# Patient Record
Sex: Female | Born: 1975
Health system: Southern US, Community
[De-identification: ages and names within clinical notes are randomized; demographics above are authoritative.]

## PROBLEM LIST (undated history)

## (undated) DIAGNOSIS — G4730 Sleep apnea, unspecified: Secondary | ICD-10-CM

## (undated) DIAGNOSIS — E669 Obesity, unspecified: Secondary | ICD-10-CM

## (undated) DIAGNOSIS — F17200 Nicotine dependence, unspecified, uncomplicated: Secondary | ICD-10-CM

## (undated) DIAGNOSIS — M1990 Unspecified osteoarthritis, unspecified site: Secondary | ICD-10-CM

## (undated) DIAGNOSIS — N809 Endometriosis, unspecified: Secondary | ICD-10-CM

## (undated) DIAGNOSIS — F329 Major depressive disorder, single episode, unspecified: Secondary | ICD-10-CM

## (undated) DIAGNOSIS — G894 Chronic pain syndrome: Secondary | ICD-10-CM

## (undated) DIAGNOSIS — E039 Hypothyroidism, unspecified: Secondary | ICD-10-CM

## (undated) DIAGNOSIS — F419 Anxiety disorder, unspecified: Secondary | ICD-10-CM

## (undated) DIAGNOSIS — G4700 Insomnia, unspecified: Secondary | ICD-10-CM

## (undated) DIAGNOSIS — F32A Depression, unspecified: Secondary | ICD-10-CM

## (undated) HISTORY — DX: Obesity, unspecified: E66.9

## (undated) HISTORY — DX: Insomnia, unspecified: G47.00

## (undated) HISTORY — PX: VAGINAL HYSTERECTOMY: SUR661

## (undated) HISTORY — DX: Major depressive disorder, single episode, unspecified: F32.9

## (undated) HISTORY — DX: Endometriosis, unspecified: N80.9

## (undated) HISTORY — DX: Chronic pain syndrome: G89.4

## (undated) HISTORY — DX: Nicotine dependence, unspecified, uncomplicated: F17.200

## (undated) HISTORY — PX: ANTERIOR CRUCIATE LIGAMENT REPAIR: SHX115

## (undated) HISTORY — DX: Depression, unspecified: F32.A

---

## 1998-12-21 ENCOUNTER — Other Ambulatory Visit: Admission: RE | Admit: 1998-12-21 | Discharge: 1998-12-21 | Payer: Self-pay | Admitting: Obstetrics and Gynecology

## 1999-08-12 ENCOUNTER — Ambulatory Visit (HOSPITAL_BASED_OUTPATIENT_CLINIC_OR_DEPARTMENT_OTHER): Admission: RE | Admit: 1999-08-12 | Discharge: 1999-08-13 | Payer: Self-pay | Admitting: Specialist

## 1999-12-25 ENCOUNTER — Other Ambulatory Visit: Admission: RE | Admit: 1999-12-25 | Discharge: 1999-12-25 | Payer: Self-pay | Admitting: Obstetrics and Gynecology

## 2000-03-01 ENCOUNTER — Encounter: Payer: Self-pay | Admitting: Emergency Medicine

## 2000-03-01 ENCOUNTER — Emergency Department (HOSPITAL_COMMUNITY): Admission: EM | Admit: 2000-03-01 | Discharge: 2000-03-01 | Payer: Self-pay | Admitting: Emergency Medicine

## 2001-11-23 ENCOUNTER — Encounter: Payer: Self-pay | Admitting: Emergency Medicine

## 2001-11-23 ENCOUNTER — Emergency Department (HOSPITAL_COMMUNITY): Admission: EM | Admit: 2001-11-23 | Discharge: 2001-11-23 | Payer: Self-pay | Admitting: Emergency Medicine

## 2002-04-26 ENCOUNTER — Inpatient Hospital Stay (HOSPITAL_COMMUNITY): Admission: AD | Admit: 2002-04-26 | Discharge: 2002-04-26 | Payer: Self-pay | Admitting: *Deleted

## 2002-04-26 ENCOUNTER — Encounter: Payer: Self-pay | Admitting: *Deleted

## 2002-06-16 ENCOUNTER — Encounter: Payer: Self-pay | Admitting: Obstetrics & Gynecology

## 2002-06-16 ENCOUNTER — Ambulatory Visit (HOSPITAL_COMMUNITY): Admission: RE | Admit: 2002-06-16 | Discharge: 2002-06-16 | Payer: Self-pay | Admitting: Obstetrics & Gynecology

## 2002-07-02 ENCOUNTER — Inpatient Hospital Stay (HOSPITAL_COMMUNITY): Admission: AD | Admit: 2002-07-02 | Discharge: 2002-07-02 | Payer: Self-pay | Admitting: Obstetrics

## 2002-07-07 ENCOUNTER — Ambulatory Visit (HOSPITAL_COMMUNITY): Admission: RE | Admit: 2002-07-07 | Discharge: 2002-07-07 | Payer: Self-pay | Admitting: Obstetrics & Gynecology

## 2002-07-07 ENCOUNTER — Encounter: Payer: Self-pay | Admitting: Obstetrics & Gynecology

## 2002-08-23 ENCOUNTER — Encounter: Payer: Self-pay | Admitting: Obstetrics & Gynecology

## 2002-08-23 ENCOUNTER — Ambulatory Visit (HOSPITAL_COMMUNITY): Admission: RE | Admit: 2002-08-23 | Discharge: 2002-08-23 | Payer: Self-pay | Admitting: Obstetrics & Gynecology

## 2002-11-23 ENCOUNTER — Inpatient Hospital Stay (HOSPITAL_COMMUNITY): Admission: AD | Admit: 2002-11-23 | Discharge: 2002-11-25 | Payer: Self-pay | Admitting: Obstetrics & Gynecology

## 2002-11-23 ENCOUNTER — Ambulatory Visit (HOSPITAL_COMMUNITY): Admission: RE | Admit: 2002-11-23 | Discharge: 2002-11-23 | Payer: Self-pay | Admitting: Obstetrics & Gynecology

## 2002-11-23 ENCOUNTER — Encounter: Payer: Self-pay | Admitting: Obstetrics & Gynecology

## 2002-11-23 ENCOUNTER — Encounter (INDEPENDENT_AMBULATORY_CARE_PROVIDER_SITE_OTHER): Payer: Self-pay

## 2002-11-24 ENCOUNTER — Encounter (INDEPENDENT_AMBULATORY_CARE_PROVIDER_SITE_OTHER): Payer: Self-pay

## 2002-12-02 ENCOUNTER — Encounter: Admission: RE | Admit: 2002-12-02 | Discharge: 2003-01-01 | Payer: Self-pay | Admitting: Obstetrics and Gynecology

## 2004-02-18 HISTORY — PX: PLACEMENT OF BREAST IMPLANTS: SHX6334

## 2009-11-03 ENCOUNTER — Emergency Department (HOSPITAL_BASED_OUTPATIENT_CLINIC_OR_DEPARTMENT_OTHER): Admission: EM | Admit: 2009-11-03 | Discharge: 2009-11-03 | Payer: Self-pay | Admitting: Emergency Medicine

## 2009-11-03 ENCOUNTER — Ambulatory Visit: Payer: Self-pay | Admitting: Diagnostic Radiology

## 2009-11-27 ENCOUNTER — Encounter
Admission: RE | Admit: 2009-11-27 | Discharge: 2010-01-30 | Payer: Self-pay | Source: Home / Self Care | Attending: Orthopedic Surgery | Admitting: Orthopedic Surgery

## 2010-05-02 LAB — CBC
Hemoglobin: 12.8 g/dL (ref 12.0–15.0)
MCV: 87.3 fL (ref 78.0–100.0)
Platelets: 243 10*3/uL (ref 150–400)
RBC: 4.12 MIL/uL (ref 3.87–5.11)
WBC: 9 10*3/uL (ref 4.0–10.5)

## 2010-05-02 LAB — BASIC METABOLIC PANEL
Calcium: 8.3 mg/dL — ABNORMAL LOW (ref 8.4–10.5)
Chloride: 108 mEq/L (ref 96–112)
Creatinine, Ser: 0.9 mg/dL (ref 0.4–1.2)
GFR calc Af Amer: >60 mL/min (ref >60)
Sodium: 142 mEq/L (ref 135–145)

## 2010-05-02 LAB — DIFFERENTIAL
Lymphocytes Relative: 24 % (ref 12–46)
Lymphs Abs: 2.1 10*3/uL (ref 0.7–4.0)
Neutrophils Relative %: 70 % (ref 43–77)

## 2010-07-05 NOTE — Op Note (Signed)
[place]  Patient:    Alicia Farmer, Alicia Farmer                             MRN: [id number] Proc. Date: 08/12/99 Adm. Date:  [id number] Attending:  Gustavus Messing CC:         Yaakov Guthrie. Shon Hough, M.D.                           Operative Report  PREOPERATIVE DIAGNOSIS:  POSTOPERATIVE DIAGNOSIS:  OPERATION PERFORMED:  Bilateral breast reductions using the inferior pedicle technique, reduction of accessory breast tissue.  SURGEON:  Yaakov Guthrie. Shon Hough, M.D.  ASSISTANT:  Margaretha Sheffield, RN  ANESTHESIA:  INDICATIONS FOR PROCEDURE:  The patient is a 35 year old lady with severe macromastia, back and shoulder pain secondary to large pendulous breasts.  She wears a triple-D bra.  DESCRIPTION OF PROCEDURE:  The patient was set up and drawn for the reduction mammoplasty using the inferior pedicle technique.  She was redrawn 20 cm from the nipple areolar complex.  She then underwent general anesthesia, intubated orally.  Prep was done to the chest and breast areas in a routine fashion using Betadine soap and solution and walled off with sterile towels and drapes so as to make a sterile field.  0.25% Xylocaine with epinephrine was injected locally, 150 cc per side.  The wounds were scored with #15 blade and the skin over the inferior pedicle was de-epithelialized with a #20 blade.  After proper hemostasis, the medial and lateral fatty dermal pedicles were excised down in the line of fascia.  Hemostasis was maintained with the Bovie unit on coagulation.  Out laterally, more tissue was removed to improve symmetry in that the patient had increased ____________ aspect of the breast as well as accessory breast tissue in the axillary and latissimus dorsi regions.  After proper hemostasis ____________ , the new keyhole area was also debulked.  The flaps were then transposed ____________ after proper hemostasis and secured with 3-0 Prolene.  Subcutaneous closure was  done with 3-0 Monocryl x 2 layers and a running subcuticular stitch of 3-0 Monocryl and 5-0 Monocryl throughout the inverted T.  The wounds were drained with #10 Blake drains which were placed in the depths of the wound and brought out through the lateralmost portion of the incision and secured with 3-0 Prolene.  The wounds were cleansed.  Steri-Strips and soft dressings were applied to all the areas including Xeroform, 4 x 4s, ABDs and Hypafix tape.  She withstood the procedures very well and was taken to recovery in excellent condition. DD:  08/12/99 TD:  08/13/99 Job: [id number]

## 2010-07-05 NOTE — Op Note (Signed)
NAME:  Alicia Farmer, Alicia Farmer                         ACCOUNT NO.:  [id number]   MEDICAL RECORD NO.:  [id number]                   PATIENT TYPE:  INP   LOCATION:  9127                                 FACILITY:  [facility]   PHYSICIAN:  Roseanna Rainbow, M.D.         DATE OF BIRTH:  1976-01-04   DATE OF PROCEDURE:  11/24/2002  DATE OF DISCHARGE:                                 OPERATIVE REPORT   PREOPERATIVE DIAGNOSIS:  Multipara, desires permanent sterilization.   POSTOPERATIVE DIAGNOSIS:  Multipara, desires permanent sterilization.   PROCEDURE:  Postpartum tubal ligation, modified Pomeroy method.   SURGEON:  Roseanna Rainbow, M.D.   ANESTHESIA:  Epidural, local.   COMPLICATIONS:  None.   ESTIMATED BLOOD LOSS:  Less than 20 mL.   INDICATIONS:  The patient is a 35 year old para 3, status post NSVD, who  desires permanent sterilization.  The risks and benefits of the procedure  were discussed with the patient including the risks of failure of four to  eight per 1000 with an increased risk of an ectopic pregnancy if pregnancy  occurs.   FINDINGS:  Normal tubes.   DESCRIPTION OF PROCEDURE:  The patient was taken to the operating room,  where her epidural anesthetic was found to be one-sided.  The infraumbilical  area was then infiltrated with 0.25% Marcaine.  A small transverse  infraumbilical skin incision was then made with a scalpel.  The incision was  carried down through the underlying fascia.  The peritoneum was identified  and entered.  The peritoneum was noted to be free of any adhesions.  The  patient's left fallopian tube was then identified, brought to the incision,  and grasped with a Babcock clamp.  The tube was then followed out to the  fimbriae.  The Babcock clamp was then used to grasp the tube approximately 4  cm from the cornual region.  A 2-3 cm segment of tube was then doubly  ligated with free ties of plain gut and excised.  Good hemostasis was noted  and  the tube was returned to the abdomen.  The right fallopian tube was  manipulated in a similar fashion.  The peritoneum and fascia were closed in  a single layer using 0 Vicryl.  The skin was closed in a subcuticular  fashion using 3-0 Vicryl.  The patient tolerated the procedure well.  Sponge, lap, and needle counts were correct x2.  The patient was taken to  the PACU in stable condition.   PATHOLOGY:  Segments of right and left fallopian tubes.                                               Roseanna Rainbow, M.D.    Judee Clara  D:  11/24/2002  T:  11/25/2002  Job:  [id number]

## 2016-02-18 HISTORY — PX: ARTHROPLASTY: SHX135

## 2016-02-26 ENCOUNTER — Other Ambulatory Visit (HOSPITAL_COMMUNITY): Payer: Self-pay | Admitting: General Surgery

## 2016-03-04 ENCOUNTER — Encounter: Payer: BLUE CROSS/BLUE SHIELD | Attending: General Surgery | Admitting: Skilled Nursing Facility1

## 2016-03-04 ENCOUNTER — Encounter: Payer: Self-pay | Admitting: Skilled Nursing Facility1

## 2016-03-04 DIAGNOSIS — Z6841 Body Mass Index (BMI) 40.0 and over, adult: Secondary | ICD-10-CM | POA: Diagnosis not present

## 2016-03-04 DIAGNOSIS — Z713 Dietary counseling and surveillance: Secondary | ICD-10-CM | POA: Insufficient documentation

## 2016-03-04 DIAGNOSIS — E6609 Other obesity due to excess calories: Secondary | ICD-10-CM

## 2016-03-04 NOTE — Progress Notes (Signed)
  Pre-Op Assessment Visit:  Pre-Operative Roux-en-Y Surgery  Medical Nutrition Therapy:  Appt start time: 9:13   End time:  9:50  Patient was seen on 03/04/2016 for Pre-Operative Nutrition Assessment. Assessment and letter of approval faxed to [facility] Surgery Bariatric Surgery Program coordinator on 03/04/2016.   Preferred Learning Style:   No preference indicated   Learning Readiness:   Change in progress  Handouts given during visit include:  Pre-Op Goals Bariatric Surgery Protein Shakes  During the appointment today the following Pre-Op Goals were reviewed with the patient: Maintain or lose weight as instructed by your surgeon Make healthy food choices Begin to limit portion sizes Limited concentrated sugars and fried foods Keep fat/sugar in the single digits per serving on food labels Practice CHEWING your food  (aim for 30 chews per bite or until applesauce consistency) Practice not drinking 15 minutes before, during, and 30 minutes after each meal/snack Avoid all carbonated beverages  Avoid/limit caffeinated beverages  Avoid all sugar-sweetened beverages Consume 3 meals per day; eat every 3-5 hours Make a list of non-food related activities Aim for 64-100 ounces of FLUID daily  Aim for at least 60-80 grams of PROTEIN daily Look for a liquid protein source that contain ?15 g protein and ?5 g carbohydrate  (ex: shakes, drinks, shots)  Patient-Centered Goals: 10/10 specific/non-scale and confidence/importance scale 1-10  Demonstrated degree of understanding via:  Teach Back  Teaching Method Utilized:  Visual Auditory Hands on  Barriers to learning/adherence to lifestyle change: 10/10  Patient to call the [facility] to enroll in Pre-Op and Post-Op Nutrition Education when surgery date is scheduled.

## 2016-03-04 NOTE — Patient Instructions (Signed)
Follow Pre-Op Goals Try Protein Shakes Call [facility] at [phone] when surgery is scheduled to enroll in Pre-Op Class  Things to remember:  Please always be honest with us. We want to support you!  If you have any questions or concerns in between appointments, please call or email Liz, Leslie, or Laurie.  The diet after surgery will be high protein and low in carbohydrate.  Vitamins and calcium need to be taken for the rest of your life.  Feel free to include support people in any classes or appointments.   Supplement recommendations:  Before Surgery   1 Complete Multivitamin with Iron  3000 IU Vitamin D3  After Surgery   2 Chewable Multivitamins  **Best Choice - Bariatric Advantage Advanced Multi EA      3 Chewable Calcium (500 mg each, total 1200-1500 mg per day)  **Best Choice - Celebrate, Bariatric Advantage, or Wellesse  Other Options:    2 Flinstones Complete + up to 100 mg Thiamin + 2000-3000 IU Vitamin D3 + 350-500 mcg Vitamin B12 + 30-45 mg Iron (with history of deficiency)  2 Celebrate MultiComplete with 18 mg Iron (this provides 6000 IU of  Vitamin D3)  4 Celebrate Essential Multi 2 in 1 (has calcium) + 18-60 mg separate  iron  Vitamins and Calcium are available at:   [facility]   [address], [place], [place] [address]   [email or web address]  [email or web address]  [email or web address]   

## 2016-03-12 ENCOUNTER — Ambulatory Visit (HOSPITAL_COMMUNITY)
Admission: RE | Admit: 2016-03-12 | Discharge: 2016-03-12 | Disposition: A | Discharge status: Home / Self Care | Payer: BLUE CROSS/BLUE SHIELD | Source: Ambulatory Visit | Attending: General Surgery | Admitting: General Surgery

## 2016-03-12 DIAGNOSIS — Z01818 Encounter for other preprocedural examination: Secondary | ICD-10-CM | POA: Insufficient documentation

## 2016-03-12 DIAGNOSIS — Z01810 Encounter for preprocedural cardiovascular examination: Secondary | ICD-10-CM | POA: Diagnosis present

## 2016-04-03 ENCOUNTER — Ambulatory Visit (INDEPENDENT_AMBULATORY_CARE_PROVIDER_SITE_OTHER): Payer: BLUE CROSS/BLUE SHIELD | Admitting: Psychiatry

## 2016-04-03 DIAGNOSIS — F509 Eating disorder, unspecified: Secondary | ICD-10-CM

## 2016-04-10 ENCOUNTER — Ambulatory Visit (INDEPENDENT_AMBULATORY_CARE_PROVIDER_SITE_OTHER): Payer: BLUE CROSS/BLUE SHIELD | Admitting: Pulmonary Disease

## 2016-04-10 ENCOUNTER — Encounter: Payer: Self-pay | Admitting: Pulmonary Disease

## 2016-04-10 VITALS — BP 126/82 | HR 102 | Ht 69.0 in | Wt 273.6 lb

## 2016-04-10 DIAGNOSIS — G4733 Obstructive sleep apnea (adult) (pediatric): Secondary | ICD-10-CM | POA: Diagnosis not present

## 2016-04-10 NOTE — Assessment & Plan Note (Signed)
Given excessive daytime somnolence, narrow pharyngeal exam, witnessed apneas & loud snoring, obstructive sleep apnea is very likely & an overnight polysomnogram will be scheduled as a split study. The pathophysiology of obstructive sleep apnea , it's cardiovascular consequences & modes of treatment including CPAP were discused with the patient in detail & they evidenced understanding.  Pretest probability is high 

## 2016-04-10 NOTE — Patient Instructions (Signed)
Schedule sleep study. We discussed treatment options

## 2016-04-10 NOTE — Progress Notes (Signed)
Subjective:    Patient ID: Alicia Farmer, female    DOB: Jan 01, 1976, 41 y.o.   MRN: [id number]  HPI  41 year old smoker presents for evaluation of sleep-disordered breathing. She has gained 100 pounds over the last few years to her current weight of 273 pounds. She is undergoing bariatric evaluation and is considering gastric bypass surgery after failing diet and exercise, hence she is referred  Her husband Alicia Farmer who accompanies, reports witnessed apneas. She reports gasping episodes that wake her up from sleep on occasion and loud snoring. She actually dreams about choking in her sleep and occasional episodes of biting her tongue and her sleep and limb jerks. She reports non-refreshing sleep in spite of 10-11 hours of sleep every night. She has some degree of sleep onset insomnia and was placed on Xanax 0.5 mg at bedtime. Bedtime is around 11 PM, sleep latency varies from 30 minutes to about an hour, she sleeps on her side with 2 pillows, reports frequent nocturnal awakenings due to gasping episodes and dreams and is out of bed by 7 AM to get her son to school on a weekday. 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  Past Surgical History:  Procedure Laterality Date  . ABDOMINAL HYSTERECTOMY       Allergies  Allergen Reactions  . Meloxicam Nausea And Vomiting     Social History   Social History  . Marital status: Married    Spouse name: N/A  . Number of children: N/A  . Years of education: N/A   Occupational History  . Not on file.   Social  History Main Topics  . Smoking status: Former Smoker    Types: Cigarettes    Start date: 04/10/1996    Quit date: 02/18/2016  . Smokeless tobacco: Never Used  . Alcohol use No  . Drug use: No  . Sexual activity: Not on file   Other Topics Concern  . Not on file   Social History Narrative  . No narrative on file     Family History  Problem Relation Age of Onset  . Hypertension Other   . Hyperlipidemia Other     Review of Systems Constitutional: negative for anorexia, fevers and sweats  Eyes: negative for irritation, redness and visual disturbance  Ears, nose, mouth, throat, and face: negative for earaches, epistaxis, nasal congestion and sore throat  Respiratory: negative for cough, dyspnea on exertion, sputum and wheezing  Cardiovascular: negative for chest pain, dyspnea, lower extremity edema, orthopnea, palpitations and syncope  Gastrointestinal: negative for abdominal pain, constipation, diarrhea, melena, nausea and vomiting  Genitourinary:negative for dysuria, frequency and hematuria  Hematologic/lymphatic: negative for bleeding, easy bruising and lymphadenopathy  Musculoskeletal:negative for arthralgias, muscle weakness and stiff joints  Neurological: negative for coordination problems, gait problems, headaches and weakness  Endocrine: negative for diabetic symptoms including polydipsia, polyuria and weight loss     Objective:   Physical Exam  Gen. Pleasant, obese, in no distress, normal affect ENT - no lesions, no post nasal drip, class 2-3 airway  Neck: No JVD, no thyromegaly, no carotid bruits Lungs: no use of accessory muscles, no dullness to percussion, decreased without rales or rhonchi  Cardiovascular: Rhythm regular, heart sounds  normal, no murmurs or gallops, no peripheral edema Abdomen: soft and non-tender, no hepatosplenomegaly, BS normal. Musculoskeletal: No deformities, no cyanosis or clubbing Neuro:  alert, non focal, no tremors       Assessment &  Plan:

## 2016-04-11 ENCOUNTER — Other Ambulatory Visit: Payer: Self-pay

## 2016-04-11 DIAGNOSIS — G4733 Obstructive sleep apnea (adult) (pediatric): Secondary | ICD-10-CM

## 2016-04-15 ENCOUNTER — Telehealth: Payer: Self-pay | Admitting: Pulmonary Disease

## 2016-04-15 NOTE — Telephone Encounter (Signed)
Patient is scheduled to come in 2/28 to pick up the Home Sleep Machine. Husband is aware of this pick up time

## 2016-04-16 ENCOUNTER — Ambulatory Visit: Payer: Self-pay | Admitting: Psychiatry

## 2016-04-27 DIAGNOSIS — G4733 Obstructive sleep apnea (adult) (pediatric): Secondary | ICD-10-CM | POA: Diagnosis not present

## 2016-05-05 ENCOUNTER — Telehealth: Payer: Self-pay | Admitting: Pulmonary Disease

## 2016-05-05 DIAGNOSIS — G4733 Obstructive sleep apnea (adult) (pediatric): Secondary | ICD-10-CM | POA: Diagnosis not present

## 2016-05-05 NOTE — Telephone Encounter (Signed)
Per Dr. Elsworth Soho, HST showed mild OSA with 12/h in lateral position and 29/h in supine.   He recommends a CPAP titration if willing. If not, focus on weight loss to help.

## 2016-05-06 NOTE — Telephone Encounter (Signed)
Spoke with patient regarding results. Patient verbalized understanding. Stated she wants to try CPAP. Will send in order for a CPAP titration. Nothing else was needed at time of call.

## 2016-05-08 ENCOUNTER — Other Ambulatory Visit: Payer: Self-pay

## 2016-05-08 ENCOUNTER — Other Ambulatory Visit: Payer: Self-pay | Admitting: *Deleted

## 2016-05-08 DIAGNOSIS — G4733 Obstructive sleep apnea (adult) (pediatric): Secondary | ICD-10-CM

## 2016-05-09 ENCOUNTER — Encounter (HOSPITAL_BASED_OUTPATIENT_CLINIC_OR_DEPARTMENT_OTHER): Payer: Self-pay

## 2016-05-15 NOTE — Progress Notes (Signed)
Please place orders in EPIC as patient is being scheduled for a Pre-op appointment! Thank you! 

## 2016-05-20 NOTE — Progress Notes (Signed)
Please place orders in EPIC as patient is being scheduled for a Pre-op appointment! Thank you! 

## 2016-05-22 ENCOUNTER — Telehealth: Payer: Self-pay | Admitting: Pulmonary Disease

## 2016-05-22 NOTE — Telephone Encounter (Signed)
Spoke with the pt and notified of recs per RA  She verbalized understanding and will call for appt then  Nothing further needed

## 2016-05-22 NOTE — Telephone Encounter (Signed)
RA  Alicia Farmer from [facility] called the pt to set up her cpap and the pt refused at this time.  She stated that she will be having the gastric bypass surgery the end of the month and is hoping that she will not need to be set up with cpap.  FYI for you.  Anything further?  thanks

## 2016-05-22 NOTE — Telephone Encounter (Signed)
Reassess after weight loss -6-9 months

## 2016-05-26 ENCOUNTER — Encounter (HOSPITAL_BASED_OUTPATIENT_CLINIC_OR_DEPARTMENT_OTHER): Payer: Self-pay

## 2016-05-26 ENCOUNTER — Encounter: Payer: BLUE CROSS/BLUE SHIELD | Attending: General Surgery | Admitting: Skilled Nursing Facility1

## 2016-05-26 ENCOUNTER — Ambulatory Visit: Payer: Self-pay | Admitting: Registered"

## 2016-05-26 DIAGNOSIS — Z713 Dietary counseling and surveillance: Secondary | ICD-10-CM | POA: Diagnosis present

## 2016-05-26 DIAGNOSIS — Z6841 Body Mass Index (BMI) 40.0 and over, adult: Secondary | ICD-10-CM | POA: Insufficient documentation

## 2016-05-26 DIAGNOSIS — E669 Obesity, unspecified: Secondary | ICD-10-CM

## 2016-05-28 ENCOUNTER — Encounter: Payer: Self-pay | Admitting: Skilled Nursing Facility1

## 2016-05-28 NOTE — Progress Notes (Signed)
  Pre-Operative Nutrition Class:  Appt start time: 8177   End time:  1830.  Patient was seen on 05/26/2016 for Pre-Operative Bariatric Surgery Education at [facility].   Surgery date: 06/09/2016 Surgery type: RYGB Start weight at [facility]: 285.3 lbs Weight today: 269.3 lbs  TANITA  BODY COMP RESULTS     BMI (kg/m^2)    Fat Mass (lbs)    Fat Free Mass (lbs)    Total Body Water (lbs)    Samples given per MNT protocol. Patient educated on appropriate usage: Bariatric Advantage Multivitamin Lot # [id number] Exp: 6/19  Celebrate Vitamins Calcium Citrate Lot # [id number] Exp:10/19  Unjury Protein  Lot # [id number] Exp: sep-16-18  The following the learning objectives were met by the patient during this course:  Identify Pre-Op Dietary Goals and will begin 2 weeks pre-operatively  Identify appropriate sources of fluids and proteins   State protein recommendations and appropriate sources pre and post-operatively  Identify Post-Operative Dietary Goals and will follow for 2 weeks post-operatively  Identify appropriate multivitamin and calcium sources  Describe the need for physical activity post-operatively and will follow MD recommendations  State when to call healthcare provider regarding medication questions or post-operative complications  Handouts given during class include:  Pre-Op Bariatric Surgery Diet Handout  Protein Shake Handout  Post-Op Bariatric Surgery Nutrition Handout  [facility] Program Information Flyer  Support Group Information Flyer  [facility] Bariatric Supplements Price List  Follow-Up Plan: [facility] for diet advancement per MD.

## 2016-05-29 ENCOUNTER — Ambulatory Visit: Payer: Self-pay | Admitting: General Surgery

## 2016-05-29 NOTE — H&P (Signed)
History of Present Illness Geoffery Spruce MD; 05/29/2016 4:38 PM) The patient is a 41 year old female who presents for a bariatric surgery evaluation. Patient has completed all necessary workup in the preoperative phase for bariatric surgery. The patient is now ready to proceed with surgery. The patient has made moderate improvements in her diet and is exercising well and has no new medical issues or new medications.    Allergies Malachy Moan, RMA; 05/29/2016 4:16 PM) Meloxicam *ANALGESICS - ANTI-INFLAMMATORY*  Nausea, Vomiting.  Medication History Malachy Moan, [place]; 05/29/2016 4:17 PM) ALPRAZolam (0.5MG  Tablet, Oral) Active. Zolpidem Tartrate (10MG  Tablet, Oral) Active. Vitamin D (Ergocalciferol) (50000UNIT Capsule, Oral) Active. Citalopram Hydrobromide (40MG  Tablet, Oral) Active. Medications Reconciled    Review of Systems Geoffery Spruce MD; 05/29/2016 4:38 PM) General Present- Fatigue, Night Sweats and Weight Gain. Not Present- Appetite Loss, Chills, Fever and Weight Loss. HEENT Present- Seasonal Allergies. Not Present- Earache, Hearing Loss, Hoarseness, Nose Bleed, Oral Ulcers, Ringing in the Ears, Sinus Pain, Sore Throat, Visual Disturbances, Wears glasses/contact lenses and Yellow Eyes. Respiratory Present- Snoring. Not Present- Bloody sputum, Chronic Cough, Difficulty Breathing and Wheezing. Breast Not Present- Breast Mass, Breast Pain, Nipple Discharge and Skin Changes. Cardiovascular Present- Difficulty Breathing Lying Down, Shortness of Breath and Swelling of Extremities. Not Present- Chest Pain, Leg Cramps, Palpitations and Rapid Heart Rate. Gastrointestinal Present- Indigestion. Not Present- Abdominal Pain, Bloating, Bloody Stool, Change in Bowel Habits, Chronic diarrhea, Constipation, Difficulty Swallowing, Excessive gas, Gets full quickly at meals, Hemorrhoids, Nausea, Rectal Pain and Vomiting. Female Genitourinary Not Present- Frequency, Nocturia,  Painful Urination, Pelvic Pain and Urgency. Musculoskeletal Present- Back Pain, Joint Pain, Joint Stiffness, Muscle Pain, Muscle Weakness and Swelling of Extremities. Neurological Not Present- Decreased Memory, Fainting, Headaches, Numbness, Seizures, Tingling, Tremor, Trouble walking and Weakness. Psychiatric Present- Depression. Not Present- Anxiety, Bipolar, Change in Sleep Pattern, Fearful and Frequent crying. Endocrine Present- Hot flashes. Not Present- Cold Intolerance, Excessive Hunger, Hair Changes, Heat Intolerance and New Diabetes. Hematology Not Present- Blood Thinners, Easy Bruising, Excessive bleeding, Gland problems, HIV and Persistent Infections.  Vitals Malachy Moan RMA; 05/29/2016 4:18 PM) 05/29/2016 4:17 PM Weight: 266.2 lb Height: 69in Body Surface Area: 2.33 m Body Mass Index: 39.31 kg/m  Temp.: 98.4F  Pulse: 135 (Regular)  BP: 122/80 (Sitting, Left Arm, Standard)       Physical Exam  Addendum Note(Luke Loni Muse Kinsinger MD; 05/29/2016 5:02 PM) Physical Exam Geoffery Spruce, MD; 02/21/2016 4:30 PM) General Mental Status - Alert. General Appearance - Cooperative. Orientation - Oriented X4. Build & Nutrition - Obese. Posture - Normal posture.  Integumentary Global Assessment Normal Exam - Head/Face: no rashes, ulcers, lesions or evidence of photo damage. No palpable nodules or masses and Neck: no visible lesions or palpable masses.  Head and Neck Head - normocephalic, atraumatic with no lesions or palpable masses. Face Global Assessment - atraumatic. Thyroid Gland Characteristics - normal size and consistency.  Eye Eyeball - Bilateral - Extraocular movements intact. Sclera/Conjunctiva - Bilateral - No scleral icterus, No Discharge.  ENMT Nose and Sinuses Nose - no deformities observed, no swelling present.  Chest and Lung Exam Palpation Normal exam - Non-tender. Auscultation Breath sounds -  Normal.  Cardiovascular Auscultation Rhythm - Regular. Heart Sounds - S1 WNL and S2 WNL. Carotid arteries - No Carotid bruit.  Abdomen Inspection Normal Exam - No Visible peristalsis, No Abnormal pulsations and No Paradoxical movements. Palpation/Percussion Normal exam - Soft, Non Tender, No Rebound tenderness, No Rigidity (guarding), No hepatosplenomegaly and No Palpable  abdominal masses.  Peripheral Vascular Upper Extremity Palpation - Pulses bilaterally normal. Lower Extremity Palpation - Edema - Bilateral - No edema.  Neurologic Neurologic evaluation reveals - normal sensation and normal coordination.  Neuropsychiatric Mental status exam performed with findings of - able to articulate well with normal speech/language, rate, volume and coherence and thought content normal with ability to perform basic computations and apply abstract reasoning.  Musculoskeletal Normal Exam - Bilateral - Upper Extremity Strength Normal and Lower Extremity Strength Normal.    Assessment & Plan Geoffery Spruce MD; 05/29/2016 4:37 PM) MORBID OBESITY (E66.01) Story: As completed all requirements is ready to proceed with Roux-en-Y gastric bypass. We'll test for nicotine due to her quitting on March 06, 2016 Impression: We discussed laparoscopic Roux-en-Y gastric bypass. We discussed the preoperative, operative and postoperative process. Using diagrams, I explained the surgery in detail including the performance of an EGD near the end of the surgery to check for leak. We discussed the typical hospital course including a 2-3 day stay baring any complications. The patient was given educational material. I quoted the patient that they can expect to lose 50-70% of their excess weight with the gastric bypass. We did discuss the possibility of weight regain several years after the procedure.  We discussed the risk and benefits of surgery including but not limited to anesthesia risk, bleeding, infection,  anastomotic edema requiring a few additional days in the hospital, postop nausea, possible conversion to open procedure, incisional hernia, injury to surrounding structures, injury to surrounding structures, blood clot formation, anastomotic leak, anastomotic stricture, ulcer formation, death, respiratory complications, intestinal blockage, internal hernia, gallstone formation, vitamin and nutritional deficiencies, hair loss, weight regain, failure to lose weight and mood changes.  We discussed that before and after surgery that there would be an alteration in their diet. I explained that we have put them on a diet 2 weeks before surgery. I also explained that they would be on a liquid diet for 2 weeks after surgery. We discussed that they would have to avoid certain foods such as sugar after surgery. We discussed the importance of physical activity as well as compliance with our dietary and supplement recommendations and routine follow-up. Current Plans NICOTINE (27035)

## 2016-06-03 ENCOUNTER — Encounter (HOSPITAL_COMMUNITY): Payer: Self-pay | Admitting: Emergency Medicine

## 2016-06-03 NOTE — Patient Instructions (Addendum)
JACKLINE CASTILLA  06/03/2016   Your procedure is scheduled on: 06-09-16  Report to [facility] Main  Entrance Take [place]  elevators to 3rd floor to  [place] at [phone].   Call this number if you have problems the morning of surgery [phone]    Remember: ONLY 1 PERSON MAY GO WITH YOU TO SHORT STAY TO GET  READY MORNING OF [place].  Do not eat food or drink liquids :After Midnight.     Take these medicines the morning of surgery with A SIP OF WATER: none                                You may not have any metal on your body including hair pins and              piercings  Do not wear jewelry, make-up, lotions, powders or perfumes, deodorant             Do not wear nail polish.  Do not shave  48 hours prior to surgery.     Do not bring valuables to the hospital. [place].  Contacts, dentures or bridgework may not be worn into surgery.  Leave suitcase in the car. After surgery it may be brought to your room.                Please read over the following fact sheets you were given: _____________________________________________________________________             [facility] - Preparing for Surgery Before surgery, you can play an important role.  Because skin is not sterile, your skin needs to be as free of germs as possible.  You can reduce the number of germs on your skin by washing with CHG (chlorahexidine gluconate) soap before surgery.  CHG is an antiseptic cleaner which kills germs and bonds with the skin to continue killing germs even after washing. Please DO NOT use if you have an allergy to CHG or antibacterial soaps.  If your skin becomes reddened/irritated stop using the CHG and inform your nurse when you arrive at Short Stay. Do not shave (including legs and underarms) for at least 48 hours prior to the first CHG shower.  You may shave your face/neck. Please follow these instructions  carefully:  1.  Shower with CHG Soap the night before surgery and the  morning of Surgery.  2.  If you choose to wash your hair, wash your hair first as usual with your  normal  shampoo.  3.  After you shampoo, rinse your hair and body thoroughly to remove the  shampoo.                           4.  Use CHG as you would any other liquid soap.  You can apply chg directly  to the skin and wash                       Gently with a scrungie or clean washcloth.  5.  Apply the CHG Soap to your body ONLY FROM THE NECK DOWN.   Do not use on face/ open  Wound or open sores. Avoid contact with eyes, ears mouth and genitals (private parts).                       Wash face,  Genitals (private parts) with your normal soap.             6.  Wash thoroughly, paying special attention to the area where your surgery  will be performed.  7.  Thoroughly rinse your body with warm water from the neck down.  8.  DO NOT shower/wash with your normal soap after using and rinsing off  the CHG Soap.                9.  Pat yourself dry with a clean towel.            10.  Wear clean pajamas.            11.  Place clean sheets on your bed the night of your first shower and do not  sleep with pets. Day of Surgery : Do not apply any lotions/deodorants the morning of surgery.  Please wear clean clothes to the hospital/surgery center.  FAILURE TO FOLLOW THESE INSTRUCTIONS MAY RESULT IN THE CANCELLATION OF YOUR SURGERY PATIENT SIGNATURE_________________________________  NURSE SIGNATURE__________________________________  ________________________________________________________________________   Adam Phenix  An incentive spirometer is a tool that can help keep your lungs clear and active. This tool measures how well you are filling your lungs with each breath. Taking long deep breaths may help reverse or decrease the chance of developing breathing (pulmonary) problems (especially infection)  following:  A long period of time when you are unable to move or be active. BEFORE THE PROCEDURE   If the spirometer includes an indicator to show your best effort, your nurse or respiratory therapist will set it to a desired goal.  If possible, sit up straight or lean slightly forward. Try not to slouch.  Hold the incentive spirometer in an upright position. INSTRUCTIONS FOR USE  1. Sit on the edge of your bed if possible, or sit up as far as you can in bed or on a chair. 2. Hold the incentive spirometer in an upright position. 3. Breathe out normally. 4. Place the mouthpiece in your mouth and seal your lips tightly around it. 5. Breathe in slowly and as deeply as possible, raising the piston or the ball toward the top of the column. 6. Hold your breath for 3-5 seconds or for as long as possible. Allow the piston or ball to fall to the bottom of the column. 7. Remove the mouthpiece from your mouth and breathe out normally. 8. Rest for a few seconds and repeat Steps 1 through 7 at least 10 times every 1-2 hours when you are awake. Take your time and take a few normal breaths between deep breaths. 9. The spirometer may include an indicator to show your best effort. Use the indicator as a goal to work toward during each repetition. 10. After each set of 10 deep breaths, practice coughing to be sure your lungs are clear. If you have an incision (the cut made at the time of surgery), support your incision when coughing by placing a pillow or rolled up towels firmly against it. Once you are able to get out of bed, walk around indoors and cough well. You may stop using the incentive spirometer when instructed by your caregiver.  RISKS AND COMPLICATIONS  Take your time so you do not get  dizzy or light-headed.  If you are in pain, you may need to take or ask for pain medication before doing incentive spirometry. It is harder to take a deep breath if you are having pain. AFTER USE  Rest and  breathe slowly and easily.  It can be helpful to keep track of a log of your progress. Your caregiver can provide you with a simple table to help with this. If you are using the spirometer at home, follow these instructions: [place] IF:   You are having difficultly using the spirometer.  You have trouble using the spirometer as often as instructed.  Your pain medication is not giving enough relief while using the spirometer.  You develop fever of 100.5 F (38.1 C) or higher. SEEK IMMEDIATE MEDICAL CARE IF:   You cough up bloody sputum that had not been present before.  You develop fever of 102 F (38.9 C) or greater.  You develop worsening pain at or near the incision site. MAKE SURE YOU:   Understand these instructions.  Will watch your condition.  Will get help right away if you are not doing well or get worse. Document Released: 06/16/2006 Document Revised: 04/28/2011 Document Reviewed: 08/17/2006 [facility] Patient Information 2014 [place], [place].   ________________________________________________________________________

## 2016-06-03 NOTE — Progress Notes (Signed)
ekg 03-12-16 epic CXR 03-12-16 epic

## 2016-06-04 ENCOUNTER — Encounter (HOSPITAL_COMMUNITY): Payer: Self-pay

## 2016-06-04 ENCOUNTER — Encounter (HOSPITAL_COMMUNITY)
Admission: RE | Admit: 2016-06-04 | Discharge: 2016-06-04 | Disposition: A | Discharge status: Home / Self Care | Payer: BLUE CROSS/BLUE SHIELD | Source: Ambulatory Visit | Attending: General Surgery | Admitting: General Surgery

## 2016-06-04 DIAGNOSIS — E669 Obesity, unspecified: Secondary | ICD-10-CM | POA: Diagnosis not present

## 2016-06-04 DIAGNOSIS — Z6839 Body mass index (BMI) 39.0-39.9, adult: Secondary | ICD-10-CM | POA: Insufficient documentation

## 2016-06-04 DIAGNOSIS — Z01812 Encounter for preprocedural laboratory examination: Secondary | ICD-10-CM | POA: Diagnosis present

## 2016-06-04 HISTORY — DX: Unspecified osteoarthritis, unspecified site: M19.90

## 2016-06-04 HISTORY — DX: Anxiety disorder, unspecified: F41.9

## 2016-06-04 HISTORY — DX: Hypothyroidism, unspecified: E03.9

## 2016-06-04 HISTORY — DX: Sleep apnea, unspecified: G47.30

## 2016-06-04 LAB — CBC WITH DIFFERENTIAL/PLATELET
Basophils Absolute: 0 10*3/uL (ref 0.0–0.1)
Basophils Relative: 0 %
EOS ABS: 0.2 10*3/uL (ref 0.0–0.7)
EOS PCT: 2 %
HCT: 41.2 % (ref 36.0–46.0)
Hemoglobin: 13.7 g/dL (ref 12.0–15.0)
LYMPHS ABS: 2.5 10*3/uL (ref 0.7–4.0)
LYMPHS PCT: 36 %
MCH: 28.6 pg (ref 26.0–34.0)
MCHC: 33.3 g/dL (ref 30.0–36.0)
MCV: 86 fL (ref 78.0–100.0)
MONO ABS: 0.4 10*3/uL (ref 0.1–1.0)
Monocytes Relative: 6 %
Neutro Abs: 3.8 10*3/uL (ref 1.7–7.7)
Neutrophils Relative %: 56 %
PLATELETS: 286 10*3/uL (ref 150–400)
RBC: 4.79 MIL/uL (ref 3.87–5.11)
RDW: 13.7 % (ref 11.5–15.5)
WBC: 6.9 10*3/uL (ref 4.0–10.5)

## 2016-06-04 LAB — COMPREHENSIVE METABOLIC PANEL
ALT: 20 U/L (ref 14–54)
ANION GAP: 10 (ref 5–15)
AST: 18 U/L (ref 15–41)
Albumin: 3.9 g/dL (ref 3.5–5.0)
Alkaline Phosphatase: 50 U/L (ref 38–126)
BUN: 13 mg/dL (ref 6–20)
CHLORIDE: 105 mmol/L (ref 101–111)
CO2: 23 mmol/L (ref 22–32)
Calcium: 9.1 mg/dL (ref 8.9–10.3)
Creatinine, Ser: 0.75 mg/dL (ref 0.44–1.00)
GLUCOSE: 94 mg/dL (ref 65–99)
POTASSIUM: 3.7 mmol/L (ref 3.5–5.1)
Sodium: 138 mmol/L (ref 135–145)
TOTAL PROTEIN: 7 g/dL (ref 6.5–8.1)
Total Bilirubin: 0.8 mg/dL (ref 0.3–1.2)

## 2016-06-08 ENCOUNTER — Encounter (HOSPITAL_COMMUNITY): Payer: Self-pay | Admitting: Anesthesiology

## 2016-06-08 NOTE — Anesthesia Preprocedure Evaluation (Addendum)
Anesthesia Evaluation  Patient identified by MRN, date of birth, ID band Patient awake    Reviewed: Allergy & Precautions, NPO status , Patient's Chart, lab work & pertinent test results  Airway Mallampati: III  TM Distance: >3 FB Neck ROM: Full    Dental  (+) Teeth Intact, Dental Advisory Given   Pulmonary sleep apnea , former smoker,    breath sounds clear to auscultation       Cardiovascular negative cardio ROS   Rhythm:Regular Rate:Normal     Neuro/Psych PSYCHIATRIC DISORDERS Anxiety negative neurological ROS     GI/Hepatic negative GI ROS, Neg liver ROS,   Endo/Other  Hypothyroidism   Renal/GU negative Renal ROS     Musculoskeletal  (+) Arthritis ,   Abdominal   Peds  Hematology negative hematology ROS (+)   Anesthesia Other Findings Day of surgery medications reviewed with the patient.  Reproductive/Obstetrics negative OB ROS                           Lab Results  Component Value Date   WBC 6.9 06/04/2016   HGB 13.7 06/04/2016   HCT 41.2 06/04/2016   MCV 86.0 06/04/2016   PLT 286 06/04/2016   Lab Results  Component Value Date   CREATININE 0.75 06/04/2016   BUN 13 06/04/2016   NA 138 06/04/2016   K 3.7 06/04/2016   CL 105 06/04/2016   CO2 23 06/04/2016   No results found for: INR, PROTIME  02/2016 EKG: NSR  Anesthesia Physical Anesthesia Plan  ASA: III  Anesthesia Plan: General   Post-op Pain Management:    Induction: Intravenous  Airway Management Planned: Oral ETT  Additional Equipment:   Intra-op Plan:   Post-operative Plan: Extubation in OR  Informed Consent: I have reviewed the patients History and Physical, chart, labs and discussed the procedure including the risks, benefits and alternatives for the proposed anesthesia with the patient or authorized representative who has indicated his/her understanding and acceptance.   Dental advisory  given  Plan Discussed with: CRNA  Anesthesia Plan Comments:         Anesthesia Quick Evaluation

## 2016-06-09 ENCOUNTER — Encounter (HOSPITAL_COMMUNITY)
Admission: RE | Disposition: A | Discharge status: Home / Self Care | Payer: Self-pay | Source: Ambulatory Visit | Attending: General Surgery

## 2016-06-09 ENCOUNTER — Inpatient Hospital Stay (HOSPITAL_COMMUNITY): Payer: BLUE CROSS/BLUE SHIELD | Admitting: Anesthesiology

## 2016-06-09 ENCOUNTER — Inpatient Hospital Stay (HOSPITAL_COMMUNITY)
Admission: RE | Admit: 2016-06-09 | Discharge: 2016-06-11 | DRG: 621 | Disposition: A | Discharge status: Home / Self Care | Payer: BLUE CROSS/BLUE SHIELD | Source: Ambulatory Visit | Attending: General Surgery | Admitting: General Surgery

## 2016-06-09 ENCOUNTER — Encounter (HOSPITAL_COMMUNITY): Payer: Self-pay | Admitting: *Deleted

## 2016-06-09 DIAGNOSIS — Z6841 Body Mass Index (BMI) 40.0 and over, adult: Secondary | ICD-10-CM

## 2016-06-09 DIAGNOSIS — Z79899 Other long term (current) drug therapy: Secondary | ICD-10-CM

## 2016-06-09 DIAGNOSIS — G4733 Obstructive sleep apnea (adult) (pediatric): Secondary | ICD-10-CM | POA: Diagnosis present

## 2016-06-09 DIAGNOSIS — E039 Hypothyroidism, unspecified: Secondary | ICD-10-CM | POA: Diagnosis present

## 2016-06-09 DIAGNOSIS — I10 Essential (primary) hypertension: Secondary | ICD-10-CM | POA: Diagnosis present

## 2016-06-09 DIAGNOSIS — Z87891 Personal history of nicotine dependence: Secondary | ICD-10-CM | POA: Diagnosis not present

## 2016-06-09 DIAGNOSIS — IMO0001 Reserved for inherently not codable concepts without codable children: Secondary | ICD-10-CM | POA: Diagnosis present

## 2016-06-09 HISTORY — PX: GASTRIC ROUX-EN-Y: SHX5262

## 2016-06-09 LAB — CREATININE, SERUM
Creatinine, Ser: 0.94 mg/dL (ref 0.44–1.00)
GFR calc non Af Amer: >60 mL/min (ref >60)

## 2016-06-09 LAB — HEMOGLOBIN AND HEMATOCRIT, BLOOD
HEMATOCRIT: 37.9 % (ref 36.0–46.0)
HEMOGLOBIN: 12.7 g/dL (ref 12.0–15.0)

## 2016-06-09 LAB — CBC
HCT: 38.8 % (ref 36.0–46.0)
Hemoglobin: 12.8 g/dL (ref 12.0–15.0)
MCH: 27.8 pg (ref 26.0–34.0)
MCHC: 33 g/dL (ref 30.0–36.0)
MCV: 84.3 fL (ref 78.0–100.0)
Platelets: 278 10*3/uL (ref 150–400)
RBC: 4.6 MIL/uL (ref 3.87–5.11)
RDW: 13.8 % (ref 11.5–15.5)
WBC: 10.1 10*3/uL (ref 4.0–10.5)

## 2016-06-09 SURGERY — LAPAROSCOPIC ROUX-EN-Y GASTRIC BYPASS WITH UPPER ENDOSCOPY
Anesthesia: General

## 2016-06-09 MED ORDER — CHLORHEXIDINE GLUCONATE 4 % EX LIQD: 60.0000 mL | Freq: Once | CUTANEOUS | Status: DC

## 2016-06-09 MED ORDER — PROPOFOL 10 MG/ML IV BOLUS
INTRAVENOUS | Status: AC
  Filled 2016-06-09: qty 20

## 2016-06-09 MED ORDER — SUGAMMADEX SODIUM 500 MG/5ML IV SOLN
INTRAVENOUS | Status: DC | PRN
  Administered 2016-06-09: 400 mg via INTRAVENOUS

## 2016-06-09 MED ORDER — APREPITANT 40 MG PO CAPS
40.0000 mg | ORAL_CAPSULE | ORAL | Status: DC
  Filled 2016-06-09: qty 1

## 2016-06-09 MED ORDER — KETAMINE HCL 10 MG/ML IJ SOLN
INTRAMUSCULAR | Status: AC
  Filled 2016-06-09: qty 1

## 2016-06-09 MED ORDER — ONDANSETRON HCL 4 MG/2ML IJ SOLN
INTRAMUSCULAR | Status: AC
  Filled 2016-06-09: qty 2

## 2016-06-09 MED ORDER — 0.9 % SODIUM CHLORIDE (POUR BTL) OPTIME
TOPICAL | Status: DC | PRN
  Administered 2016-06-09: 1000 mL

## 2016-06-09 MED ORDER — LIDOCAINE 2% (20 MG/ML) 5 ML SYRINGE
INTRAMUSCULAR | Status: AC
  Filled 2016-06-09: qty 5

## 2016-06-09 MED ORDER — CEFOTETAN DISODIUM-DEXTROSE 2-2.08 GM-% IV SOLR
INTRAVENOUS | Status: AC
  Filled 2016-06-09: qty 50

## 2016-06-09 MED ORDER — FENTANYL CITRATE (PF) 100 MCG/2ML IJ SOLN
INTRAMUSCULAR | Status: AC
  Filled 2016-06-09: qty 2

## 2016-06-09 MED ORDER — MIDAZOLAM HCL 2 MG/2ML IJ SOLN
INTRAMUSCULAR | Status: AC
  Filled 2016-06-09: qty 2

## 2016-06-09 MED ORDER — GABAPENTIN 300 MG PO CAPS
300.0000 mg | ORAL_CAPSULE | ORAL | Status: AC
  Administered 2016-06-09: 300 mg via ORAL
  Filled 2016-06-09: qty 1

## 2016-06-09 MED ORDER — SODIUM CHLORIDE 0.9 % IV SOLN
INTRAVENOUS | Status: DC
  Administered 2016-06-09 – 2016-06-11 (×5): via INTRAVENOUS

## 2016-06-09 MED ORDER — SCOPOLAMINE 1 MG/3DAYS TD PT72
1.0000 | MEDICATED_PATCH | TRANSDERMAL | Status: DC
  Administered 2016-06-09: 1.5 mg via TRANSDERMAL
  Filled 2016-06-09: qty 1

## 2016-06-09 MED ORDER — HEPARIN SODIUM (PORCINE) 5000 UNIT/ML IJ SOLN
5000.0000 [IU] | INTRAMUSCULAR | Status: AC
  Administered 2016-06-09: 5000 [IU] via SUBCUTANEOUS
  Filled 2016-06-09: qty 1

## 2016-06-09 MED ORDER — ENOXAPARIN SODIUM 30 MG/0.3ML ~~LOC~~ SOLN
30.0000 mg | Freq: Two times a day (BID) | SUBCUTANEOUS | Status: DC
  Administered 2016-06-10 – 2016-06-11 (×3): 30 mg via SUBCUTANEOUS
  Filled 2016-06-09 (×3): qty 0.3

## 2016-06-09 MED ORDER — PHENYLEPHRINE 40 MCG/ML (10ML) SYRINGE FOR IV PUSH (FOR BLOOD PRESSURE SUPPORT)
PREFILLED_SYRINGE | INTRAVENOUS | Status: AC
  Filled 2016-06-09: qty 10

## 2016-06-09 MED ORDER — ONDANSETRON HCL 4 MG/2ML IJ SOLN
4.0000 mg | INTRAMUSCULAR | Status: DC | PRN
  Administered 2016-06-10 (×2): 4 mg via INTRAVENOUS
  Filled 2016-06-09 (×2): qty 2

## 2016-06-09 MED ORDER — SUCCINYLCHOLINE CHLORIDE 200 MG/10ML IV SOSY
PREFILLED_SYRINGE | INTRAVENOUS | Status: DC | PRN
Start: 2016-06-09 — End: 2016-06-09
  Administered 2016-06-09: 140 mg via INTRAVENOUS

## 2016-06-09 MED ORDER — LABETALOL HCL 5 MG/ML IV SOLN
INTRAVENOUS | Status: AC
  Administered 2016-06-09: 5 mg
  Filled 2016-06-09: qty 4

## 2016-06-09 MED ORDER — ACETAMINOPHEN 500 MG PO TABS
1000.0000 mg | ORAL_TABLET | ORAL | Status: AC
  Administered 2016-06-09: 1000 mg via ORAL
  Filled 2016-06-09: qty 2

## 2016-06-09 MED ORDER — PROMETHAZINE HCL 25 MG/ML IJ SOLN
6.2500 mg | INTRAMUSCULAR | Status: AC | PRN
  Administered 2016-06-09 (×2): 12.5 mg via INTRAVENOUS

## 2016-06-09 MED ORDER — ACETAMINOPHEN 160 MG/5ML PO SOLN
325.0000 mg | ORAL | Status: DC | PRN
  Administered 2016-06-10 – 2016-06-11 (×4): 650 mg via ORAL
  Filled 2016-06-09 (×4): qty 20.3

## 2016-06-09 MED ORDER — SIMETHICONE 40 MG/0.6ML PO SUSP
80.0000 mg | Freq: Four times a day (QID) | ORAL | Status: DC | PRN
  Administered 2016-06-09 – 2016-06-10 (×2): 80 mg via ORAL
  Filled 2016-06-09 (×4): qty 1.2

## 2016-06-09 MED ORDER — PREMIER PROTEIN SHAKE
2.0000 [oz_av] | ORAL | Status: DC
  Administered 2016-06-10 – 2016-06-11 (×6): 2 [oz_av] via ORAL

## 2016-06-09 MED ORDER — HYDROMORPHONE HCL 1 MG/ML IJ SOLN
INTRAMUSCULAR | Status: AC
  Filled 2016-06-09: qty 1

## 2016-06-09 MED ORDER — BUPIVACAINE HCL (PF) 0.25 % IJ SOLN
INTRAMUSCULAR | Status: AC
  Filled 2016-06-09: qty 30

## 2016-06-09 MED ORDER — ROCURONIUM BROMIDE 10 MG/ML (PF) SYRINGE
PREFILLED_SYRINGE | INTRAVENOUS | Status: DC | PRN
  Administered 2016-06-09: 1 mg via INTRAVENOUS
  Administered 2016-06-09: 50 mg via INTRAVENOUS
  Administered 2016-06-09: 10 mg via INTRAVENOUS

## 2016-06-09 MED ORDER — ROCURONIUM BROMIDE 50 MG/5ML IV SOSY
PREFILLED_SYRINGE | INTRAVENOUS | Status: AC
  Filled 2016-06-09: qty 5

## 2016-06-09 MED ORDER — SUGAMMADEX SODIUM 500 MG/5ML IV SOLN
INTRAVENOUS | Status: AC
  Filled 2016-06-09: qty 5

## 2016-06-09 MED ORDER — LIDOCAINE 2% (20 MG/ML) 5 ML SYRINGE
INTRAMUSCULAR | Status: DC | PRN
  Administered 2016-06-09: 100 mg via INTRAVENOUS

## 2016-06-09 MED ORDER — LACTATED RINGERS IV SOLN: INTRAVENOUS | Status: DC

## 2016-06-09 MED ORDER — MEPERIDINE HCL 50 MG/ML IJ SOLN: 6.2500 mg | INTRAMUSCULAR | Status: DC | PRN

## 2016-06-09 MED ORDER — PANTOPRAZOLE SODIUM 40 MG IV SOLR
40.0000 mg | Freq: Every day | INTRAVENOUS | Status: DC
  Administered 2016-06-09 – 2016-06-10 (×2): 40 mg via INTRAVENOUS
  Filled 2016-06-09 (×2): qty 40

## 2016-06-09 MED ORDER — BUPIVACAINE LIPOSOME 1.3 % IJ SUSP
20.0000 mL | Freq: Once | INTRAMUSCULAR | Status: AC
  Administered 2016-06-09: 20 mL
  Filled 2016-06-09: qty 20

## 2016-06-09 MED ORDER — MIDAZOLAM HCL 5 MG/5ML IJ SOLN
INTRAMUSCULAR | Status: DC | PRN
  Administered 2016-06-09: 2 mg via INTRAVENOUS

## 2016-06-09 MED ORDER — LACTATED RINGERS IR SOLN
Status: DC | PRN
  Administered 2016-06-09: 1000 mL

## 2016-06-09 MED ORDER — HYDROMORPHONE HCL 1 MG/ML IJ SOLN
0.2500 mg | INTRAMUSCULAR | Status: DC | PRN
  Administered 2016-06-09 (×4): 0.5 mg via INTRAVENOUS

## 2016-06-09 MED ORDER — OXYCODONE HCL 5 MG/5ML PO SOLN
5.0000 mg | ORAL | Status: DC | PRN
  Administered 2016-06-09 – 2016-06-10 (×4): 10 mg via ORAL
  Administered 2016-06-11: 5 mg via ORAL
  Administered 2016-06-11: 10 mg via ORAL
  Filled 2016-06-09 (×7): qty 10

## 2016-06-09 MED ORDER — DEXAMETHASONE SODIUM PHOSPHATE 10 MG/ML IJ SOLN
INTRAMUSCULAR | Status: AC
  Filled 2016-06-09: qty 1

## 2016-06-09 MED ORDER — SUCCINYLCHOLINE CHLORIDE 200 MG/10ML IV SOSY
PREFILLED_SYRINGE | INTRAVENOUS | Status: AC
  Filled 2016-06-09: qty 10

## 2016-06-09 MED ORDER — BUPIVACAINE HCL (PF) 0.25 % IJ SOLN
INTRAMUSCULAR | Status: DC | PRN
  Administered 2016-06-09: 30 mL

## 2016-06-09 MED ORDER — LIDOCAINE 2% (20 MG/ML) 5 ML SYRINGE
INTRAMUSCULAR | Status: DC | PRN
  Administered 2016-06-09: 1.5 mg/kg/h via INTRAVENOUS

## 2016-06-09 MED ORDER — DEXAMETHASONE SODIUM PHOSPHATE 10 MG/ML IJ SOLN: 4.0000 mg | INTRAMUSCULAR | Status: DC

## 2016-06-09 MED ORDER — ONDANSETRON HCL 4 MG/2ML IJ SOLN
INTRAMUSCULAR | Status: DC | PRN
  Administered 2016-06-09: 4 mg via INTRAVENOUS

## 2016-06-09 MED ORDER — MORPHINE SULFATE (PF) 4 MG/ML IV SOLN
1.0000 mg | INTRAVENOUS | Status: DC | PRN
  Administered 2016-06-09 (×2): 3 mg via INTRAVENOUS
  Administered 2016-06-09: 2 mg via INTRAVENOUS
  Administered 2016-06-09 – 2016-06-11 (×7): 3 mg via INTRAVENOUS
  Filled 2016-06-09 (×10): qty 1

## 2016-06-09 MED ORDER — PROMETHAZINE HCL 25 MG/ML IJ SOLN
INTRAMUSCULAR | Status: AC
  Filled 2016-06-09: qty 1

## 2016-06-09 MED ORDER — ACETAMINOPHEN 325 MG PO TABS
650.0000 mg | ORAL_TABLET | ORAL | Status: DC | PRN
Start: 2016-06-09 — End: 2016-06-11

## 2016-06-09 MED ORDER — PHENYLEPHRINE 40 MCG/ML (10ML) SYRINGE FOR IV PUSH (FOR BLOOD PRESSURE SUPPORT)
PREFILLED_SYRINGE | INTRAVENOUS | Status: DC | PRN
  Administered 2016-06-09: 80 ug via INTRAVENOUS

## 2016-06-09 MED ORDER — KETAMINE HCL 10 MG/ML IJ SOLN
INTRAMUSCULAR | Status: DC | PRN
  Administered 2016-06-09: 30 mg via INTRAVENOUS
  Administered 2016-06-09: 10 mg via INTRAVENOUS

## 2016-06-09 MED ORDER — FENTANYL CITRATE (PF) 100 MCG/2ML IJ SOLN
INTRAMUSCULAR | Status: DC | PRN
  Administered 2016-06-09 (×4): 100 ug via INTRAVENOUS
  Administered 2016-06-09 (×2): 50 ug via INTRAVENOUS

## 2016-06-09 MED ORDER — PROPOFOL 10 MG/ML IV BOLUS
INTRAVENOUS | Status: DC | PRN
  Administered 2016-06-09: 200 mg via INTRAVENOUS

## 2016-06-09 MED ORDER — CEFOTETAN DISODIUM-DEXTROSE 2-2.08 GM-% IV SOLR
2.0000 g | INTRAVENOUS | Status: AC
  Administered 2016-06-09: 2 g via INTRAVENOUS

## 2016-06-09 MED ORDER — LACTATED RINGERS IV SOLN
INTRAVENOUS | Status: DC | PRN
  Administered 2016-06-09 (×3): via INTRAVENOUS

## 2016-06-09 MED ORDER — DEXAMETHASONE SODIUM PHOSPHATE 10 MG/ML IJ SOLN
INTRAMUSCULAR | Status: DC | PRN
  Administered 2016-06-09: 10 mg via INTRAVENOUS

## 2016-06-09 SURGICAL SUPPLY — 67 items
APPLIER CLIP 5 13 M/L LIGAMAX5 (MISCELLANEOUS)
APPLIER CLIP ROT 10 11.4 M/L (STAPLE)
APPLIER CLIP ROT 13.4 12 LRG (CLIP)
BANDAGE ADH SHEER 1  50/CT (GAUZE/BANDAGES/DRESSINGS) IMPLANT
BENZOIN TINCTURE PRP APPL 2/3 (GAUZE/BANDAGES/DRESSINGS) ×3 IMPLANT
BLADE SURG SZ11 CARB STEEL (BLADE) ×3 IMPLANT
CABLE HIGH FREQUENCY MONO STRZ (ELECTRODE) ×3 IMPLANT
CHLORAPREP W/TINT 26ML (MISCELLANEOUS) ×3 IMPLANT
CLIP APPLIE 5 13 M/L LIGAMAX5 (MISCELLANEOUS) IMPLANT
CLIP APPLIE ROT 10 11.4 M/L (STAPLE) IMPLANT
CLIP APPLIE ROT 13.4 12 LRG (CLIP) IMPLANT
CLOSURE WOUND 1/2 X4 (GAUZE/BANDAGES/DRESSINGS) ×1
COVER SURGICAL LIGHT HANDLE (MISCELLANEOUS) ×3 IMPLANT
DEVICE PMI PUNCTURE CLOSURE (MISCELLANEOUS) ×3 IMPLANT
DEVICE SUTURE ENDOST 10MM (ENDOMECHANICALS) ×3 IMPLANT
DRAIN CHANNEL 19F RND (DRAIN) IMPLANT
DRAIN PENROSE 18X1/4 LTX STRL (WOUND CARE) ×3 IMPLANT
ELECT L-HOOK LAP 45CM DISP (ELECTROSURGICAL) ×3
ELECT PENCIL ROCKER SW 15FT (MISCELLANEOUS) ×3 IMPLANT
ELECTRODE L-HOOK LAP 45CM DISP (ELECTROSURGICAL) ×1 IMPLANT
EVACUATOR SILICONE 100CC (DRAIN) IMPLANT
GAUZE SPONGE 4X4 12PLY STRL (GAUZE/BANDAGES/DRESSINGS) IMPLANT
GAUZE SPONGE 4X4 16PLY XRAY LF (GAUZE/BANDAGES/DRESSINGS) ×3 IMPLANT
GLOVE BIOGEL PI IND STRL 7.0 (GLOVE) ×1 IMPLANT
GLOVE BIOGEL PI INDICATOR 7.0 (GLOVE) ×2
GLOVE SURG SS PI 7.0 STRL IVOR (GLOVE) ×3 IMPLANT
GOWN STRL REUS W/TWL LRG LVL3 (GOWN DISPOSABLE) ×3 IMPLANT
GOWN STRL REUS W/TWL XL LVL3 (GOWN DISPOSABLE) ×9 IMPLANT
HANDLE STAPLE EGIA 4 XL (STAPLE) ×3 IMPLANT
HOVERMATT SINGLE USE (MISCELLANEOUS) ×3 IMPLANT
KIT BASIN OR (CUSTOM PROCEDURE TRAY) ×3 IMPLANT
KIT GASTRIC LAVAGE 34FR ADT (SET/KITS/TRAYS/PACK) ×3 IMPLANT
MARKER SKIN DUAL TIP RULER LAB (MISCELLANEOUS) ×3 IMPLANT
NEEDLE SPNL 22GX3.5 QUINCKE BK (NEEDLE) ×3 IMPLANT
PACK CARDIOVASCULAR III (CUSTOM PROCEDURE TRAY) ×3 IMPLANT
RELOAD EGIA 45 MED/THCK PURPLE (STAPLE) ×3 IMPLANT
RELOAD EGIA 45 TAN VASC (STAPLE) IMPLANT
RELOAD EGIA 60 MED/THCK PURPLE (STAPLE) ×12 IMPLANT
RELOAD EGIA 60 TAN VASC (STAPLE) ×12 IMPLANT
RELOAD ENDO STITCH 2.0 (ENDOMECHANICALS) ×26
SCISSORS LAP 5X45 EPIX DISP (ENDOMECHANICALS) ×3 IMPLANT
SET IRRIG TUBING LAPAROSCOPIC (IRRIGATION / IRRIGATOR) ×3 IMPLANT
SHEARS HARMONIC ACE PLUS 45CM (MISCELLANEOUS) ×3 IMPLANT
SLEEVE XCEL OPT CAN 5 100 (ENDOMECHANICALS) ×9 IMPLANT
SOLUTION ANTI FOG 6CC (MISCELLANEOUS) ×3 IMPLANT
STAPLER VISISTAT 35W (STAPLE) IMPLANT
STRIP CLOSURE SKIN 1/2X4 (GAUZE/BANDAGES/DRESSINGS) ×2 IMPLANT
SUT ETHILON 2 0 PS N (SUTURE) IMPLANT
SUT MNCRL AB 4-0 PS2 18 (SUTURE) ×3 IMPLANT
SUT RELOAD ENDO STITCH 2 48X1 (ENDOMECHANICALS) ×6
SUT RELOAD ENDO STITCH 2.0 (ENDOMECHANICALS) ×7
SUT SILK 0 SH 30 (SUTURE) IMPLANT
SUT VICRYL 0 UR6 27IN ABS (SUTURE) ×3 IMPLANT
SUTURE RELOAD END STTCH 2 48X1 (ENDOMECHANICALS) ×6 IMPLANT
SUTURE RELOAD ENDO STITCH 2.0 (ENDOMECHANICALS) ×7 IMPLANT
SYR 20CC LL (SYRINGE) ×3 IMPLANT
SYR 50ML LL SCALE MARK (SYRINGE) ×3 IMPLANT
TOWEL OR 17X26 10 PK STRL BLUE (TOWEL DISPOSABLE) ×3 IMPLANT
TOWEL OR NON WOVEN STRL DISP B (DISPOSABLE) ×3 IMPLANT
TRAY FOLEY W/METER SILVER 14FR (SET/KITS/TRAYS/PACK) ×3 IMPLANT
TRAY FOLEY W/METER SILVER 16FR (SET/KITS/TRAYS/PACK) ×3 IMPLANT
TROCAR BLADELESS OPT 5 100 (ENDOMECHANICALS) ×3 IMPLANT
TROCAR XCEL 12X100 BLDLESS (ENDOMECHANICALS) ×3 IMPLANT
TUBING CONNECTING 10 (TUBING) ×2 IMPLANT
TUBING CONNECTING 10' (TUBING) ×1
TUBING ENDO SMARTCAP PENTAX (MISCELLANEOUS) ×3 IMPLANT
TUBING INSUF HEATED (TUBING) ×3 IMPLANT

## 2016-06-09 NOTE — Op Note (Signed)
Preop Diagnosis: Obesity Class III  Postop Diagnosis: same  Procedure performed: laparoscopic Roux en Y gastric bypass  Assitant: Greer Pickerel  Indications:  The patient is a 41 y.o. year-old morbidly obese female who has been followed in the Bariatric Clinic as an outpatient. This patient was diagnosed with morbid obesity with a BMI of Body mass index is 38.81 kg/m. and significant co-morbidities including hypertension.  The patient was counseled extensively in the Bariatric Outpatient Clinic and after a thorough explanation of the risks and benefits of surgery (including death from complications, bowel leak, infection such as peritonitis and/or sepsis, internal hernia, bleeding, need for blood transfusion, bowel obstruction, organ failure, pulmonary embolus, deep venous thrombosis, wound infection, incisional hernia, skin breakdown, and others entailed on the consent form) and after a compliant diet and exercise program, the patient was scheduled for an elective laparoscopic sleeve gastrectomy.  Description of Operation:  Following informed consent, the patient was taken to the operating room and placed on the operating table in the supine position.  She had previously received prophylactic antibiotics and subcutaneous heparin for DVT prophylaxis in the pre-op holding area.  After induction of general endotracheal anesthesia by the anesthesiologist, the patient underwent placement of sequential compression devices, Foley catheter and an oro-gastric tube.  A timeout was confirmed by the surgery and anesthesia teams.  The patient was adequately padded at all pressure points and placed on a footboard to prevent slippage from the OR table during extremes of position during surgery.  She underwent a routine sterile prep and drape of her entire abdomen.    Next, A transverse incision was made under the left subcostal area and a 23mm optical viewing trocar was introduced into the peritoneal cavity.  Pneumoperitoneum was applied with a high flow and low pressure. A laparoscope was inserted to confirm placement. A extraperitoneal block was then placed at the lateral abdominal wall using exparel diluted with marcaine . 5 additional trocars were placed: 1 64mm trocar to the left of the midline. 1 additional 62mm trocar in the left lateral area, 1 17mm trocar in the right mid abdomen, and 1 49mm trocar in the right subcostal area.  The greater omentum was flipped over the transverse colon and under the left lobe of the liver. The ligament of trietz was identified. 50cm of jejunum was measured starting from the ligament of Trietz. The mesentery was checked to ensure mobility. Next, a 11mm 2-41mm tristapler was used to divide the jejunum at this location. The harmonic scalpel was used to divide the mesentery down to the origin. A 1/2" penrose was sutured to the distal side. 100cm of jejunum was measured starting at the division. 2-0 silk was used to appose the biliary limb to the 100cm mark of jejunum in 2 places. Enterotomies were made in the biliary and common channels and a 55mm 2-3 tristapler was used to create the J-J anastomosis. A 2-0 silk was used to appose the enterotomy edges and a 629mm 2-3 tristapler was used to close the enterotomy. An anti-obstruction 2-0 silk suture was placed. There was an area of bleeding that a 2-0 silk was used to provide hemostasis. Next, the mesenteric defect was closed with a 2-0 silk in running fashion.The J-J appeared patent and in neutral position.  Next, the omentum was divided using the Harmonic scalpel. The patient was placed in steep Reverse Trendelenberg position. A Nathanson retracted was placed through a subxiphoid incision and used to retract the liver. The fat pad over the fundus  was incised to free the fundus. Next, a position along the lesser curve 6cm from GE junction was identified. The pars flaccida was entered and the fat over the lesser curve divided to enter  the lesser sac. Multiple 47mm 3-60mm tristaple firings were peformed to create a 6cm pouch. The Roux limb was identified using the placed penrose and brought up to the stomach in antecolic fashion. The limb was inspected to ensure a neutral position. A 2-0 vicryl suture was then used to create a posterior layer connecting the stomach to the Roux limb jejunum in running fashion. Next cautery was used to create an enterotomy along the medial aspect of this suture line and Harmonic scalpel used to create gastotomy. A 51mm 3-69mm tristapler was then used to create a 25-38mm anastomosis. 2 2-0 vicryl sutures were used in running fashion to close the gastrotomy. Finally, a 2-0 vicryl suture was used to close an anterior layer of stomach and jejunum over the anastomosis in running fashion. The penrose was removed from the Roux limb.  The assistant then went and performed an upper endoscopy and leak test. No bubbles were seen and the pouch and limb distended appropriately. The limb and pouch were deflated, the endoscope was removed. Hemostasis was ensured. Pneumoperitoneum was evacuated, all ports were removed and all incisions closed with 4-0 monocryl suture in subcuticular fashion. Glue was put in place for dressing. The patient awoke from anesthesia and was brought to pacu in stable condition. All counts were correct.  Specimens:  None  Local Anesthesia: 50 ml Exparel: 0.5% Marcaine Mix  Post-Op Plan:       Pain Management: PO, prn      Antibiotics: Prophylactic      Anticoagulation: Prophylactic, Starting now      Post Op Studies/Consults: Not applicable      Intended Discharge: within 48h      Intended Outpatient Follow-Up: Two Week      Intended Outpatient Studies: Not Applicable      Other: Not Applicable   Arta Bruce Kyshaun Barnette

## 2016-06-09 NOTE — Transfer of Care (Signed)
Immediate Anesthesia Transfer of Care Note  Patient: Alicia Farmer  Procedure(s) Performed: Procedure(s): LAPAROSCOPIC ROUX-EN-Y GASTRIC BYPASS WITH UPPER ENDOSCOPY (N/A)  Patient Location: PACU  Anesthesia Type:General  Level of Consciousness: sedated  Airway & Oxygen Therapy: Patient Spontanous Breathing and Patient connected to face mask oxygen  Post-op Assessment: Report given to RN and Post -op Vital signs reviewed and stable  Post vital signs: Reviewed and stable  Last Vitals:  Vitals:   06/09/16 0606  BP: 130/88  Pulse: (!) 114  Resp: 18  Temp: 36.6 C    Last Pain:  Vitals:   06/09/16 0606  TempSrc: Oral         Complications: No apparent anesthesia complications

## 2016-06-09 NOTE — Anesthesia Procedure Notes (Signed)
Procedure Name: Intubation Date/Time: 06/09/2016 7:34 AM Performed by: Lind Covert Pre-anesthesia Checklist: Patient identified, Emergency Drugs available, Suction available, Patient being monitored and Timeout performed Patient Re-evaluated:Patient Re-evaluated prior to inductionOxygen Delivery Method: Circle system utilized Preoxygenation: Pre-oxygenation with 100% oxygen Intubation Type: IV induction Laryngoscope Size: Mac and 4 Grade View: Grade II Tube type: Oral Tube size: 7.0 mm Number of attempts: 1 Airway Equipment and Method: Stylet Placement Confirmation: ETT inserted through vocal cords under direct vision,  positive ETCO2 and breath sounds checked- equal and bilateral Secured at: 21 cm Tube secured with: Tape Dental Injury: Teeth and Oropharynx as per pre-operative assessment

## 2016-06-09 NOTE — H&P (View-Only) (Signed)
History of Present Illness Alicia Spruce MD; 05/29/2016 4:38 PM) The patient is a 41 year old female who presents for a bariatric surgery evaluation. Patient has completed all necessary workup in the preoperative phase for bariatric surgery. The patient is now ready to proceed with surgery. The patient has made moderate improvements in her diet and is exercising well and has no new medical issues or new medications.    Allergies Malachy Moan, RMA; 05/29/2016 4:16 PM) Meloxicam *ANALGESICS - ANTI-INFLAMMATORY*  Nausea, Vomiting.  Medication History Malachy Moan, [place]; 05/29/2016 4:17 PM) ALPRAZolam (0.5MG  Tablet, Oral) Active. Zolpidem Tartrate (10MG  Tablet, Oral) Active. Vitamin D (Ergocalciferol) (50000UNIT Capsule, Oral) Active. Citalopram Hydrobromide (40MG  Tablet, Oral) Active. Medications Reconciled    Review of Systems Alicia Spruce MD; 05/29/2016 4:38 PM) General Present- Fatigue, Night Sweats and Weight Gain. Not Present- Appetite Loss, Chills, Fever and Weight Loss. HEENT Present- Seasonal Allergies. Not Present- Earache, Hearing Loss, Hoarseness, Nose Bleed, Oral Ulcers, Ringing in the Ears, Sinus Pain, Sore Throat, Visual Disturbances, Wears glasses/contact lenses and Yellow Eyes. Respiratory Present- Snoring. Not Present- Bloody sputum, Chronic Cough, Difficulty Breathing and Wheezing. Breast Not Present- Breast Mass, Breast Pain, Nipple Discharge and Skin Changes. Cardiovascular Present- Difficulty Breathing Lying Down, Shortness of Breath and Swelling of Extremities. Not Present- Chest Pain, Leg Cramps, Palpitations and Rapid Heart Rate. Gastrointestinal Present- Indigestion. Not Present- Abdominal Pain, Bloating, Bloody Stool, Change in Bowel Habits, Chronic diarrhea, Constipation, Difficulty Swallowing, Excessive gas, Gets full quickly at meals, Hemorrhoids, Nausea, Rectal Pain and Vomiting. Female Genitourinary Not Present- Frequency, Nocturia,  Painful Urination, Pelvic Pain and Urgency. Musculoskeletal Present- Back Pain, Joint Pain, Joint Stiffness, Muscle Pain, Muscle Weakness and Swelling of Extremities. Neurological Not Present- Decreased Memory, Fainting, Headaches, Numbness, Seizures, Tingling, Tremor, Trouble walking and Weakness. Psychiatric Present- Depression. Not Present- Anxiety, Bipolar, Change in Sleep Pattern, Fearful and Frequent crying. Endocrine Present- Hot flashes. Not Present- Cold Intolerance, Excessive Hunger, Hair Changes, Heat Intolerance and New Diabetes. Hematology Not Present- Blood Thinners, Easy Bruising, Excessive bleeding, Gland problems, HIV and Persistent Infections.  Vitals Malachy Moan RMA; 05/29/2016 4:18 PM) 05/29/2016 4:17 PM Weight: 266.2 lb Height: 69in Body Surface Area: 2.33 m Body Mass Index: 39.31 kg/m  Temp.: 98.30F  Pulse: 135 (Regular)  BP: 122/80 (Sitting, Left Arm, Standard)       Physical Exam  Addendum Note(Jeray Shugart Loni Muse Vedant Shehadeh MD; 05/29/2016 5:02 PM) Physical Exam Alicia Spruce, MD; 02/21/2016 4:30 PM) General Mental Status - Alert. General Appearance - Cooperative. Orientation - Oriented X4. Build & Nutrition - Obese. Posture - Normal posture.  Integumentary Global Assessment Normal Exam - Head/Face: no rashes, ulcers, lesions or evidence of photo damage. No palpable nodules or masses and Neck: no visible lesions or palpable masses.  Head and Neck Head - normocephalic, atraumatic with no lesions or palpable masses. Face Global Assessment - atraumatic. Thyroid Gland Characteristics - normal size and consistency.  Eye Eyeball - Bilateral - Extraocular movements intact. Sclera/Conjunctiva - Bilateral - No scleral icterus, No Discharge.  ENMT Nose and Sinuses Nose - no deformities observed, no swelling present.  Chest and Lung Exam Palpation Normal exam - Non-tender. Auscultation Breath sounds -  Normal.  Cardiovascular Auscultation Rhythm - Regular. Heart Sounds - S1 WNL and S2 WNL. Carotid arteries - No Carotid bruit.  Abdomen Inspection Normal Exam - No Visible peristalsis, No Abnormal pulsations and No Paradoxical movements. Palpation/Percussion Normal exam - Soft, Non Tender, No Rebound tenderness, No Rigidity (guarding), No hepatosplenomegaly and No Palpable  abdominal masses.  Peripheral Vascular Upper Extremity Palpation - Pulses bilaterally normal. Lower Extremity Palpation - Edema - Bilateral - No edema.  Neurologic Neurologic evaluation reveals - normal sensation and normal coordination.  Neuropsychiatric Mental status exam performed with findings of - able to articulate well with normal speech/language, rate, volume and coherence and thought content normal with ability to perform basic computations and apply abstract reasoning.  Musculoskeletal Normal Exam - Bilateral - Upper Extremity Strength Normal and Lower Extremity Strength Normal.    Assessment & Plan Alicia Spruce MD; 05/29/2016 4:37 PM) MORBID OBESITY (E66.01) Story: As completed all requirements is ready to proceed with Roux-en-Y gastric bypass. We'll test for nicotine due to her quitting on March 06, 2016 Impression: We discussed laparoscopic Roux-en-Y gastric bypass. We discussed the preoperative, operative and postoperative process. Using diagrams, I explained the surgery in detail including the performance of an EGD near the end of the surgery to check for leak. We discussed the typical hospital course including a 2-3 day stay baring any complications. The patient was given educational material. I quoted the patient that they can expect to lose 50-70% of their excess weight with the gastric bypass. We did discuss the possibility of weight regain several years after the procedure.  We discussed the risk and benefits of surgery including but not limited to anesthesia risk, bleeding, infection,  anastomotic edema requiring a few additional days in the hospital, postop nausea, possible conversion to open procedure, incisional hernia, injury to surrounding structures, injury to surrounding structures, blood clot formation, anastomotic leak, anastomotic stricture, ulcer formation, death, respiratory complications, intestinal blockage, internal hernia, gallstone formation, vitamin and nutritional deficiencies, hair loss, weight regain, failure to lose weight and mood changes.  We discussed that before and after surgery that there would be an alteration in their diet. I explained that we have put them on a diet 2 weeks before surgery. I also explained that they would be on a liquid diet for 2 weeks after surgery. We discussed that they would have to avoid certain foods such as sugar after surgery. We discussed the importance of physical activity as well as compliance with our dietary and supplement recommendations and routine follow-up. Current Plans NICOTINE ([id number])

## 2016-06-09 NOTE — Op Note (Signed)
Alicia Farmer [id number] [birth year] 06/09/2016  Preoperative diagnosis: morbid obesity  Postoperative diagnosis: Same   Procedure: Upper endoscopy   Surgeon: Gayland Curry M.D., FACS   Anesthesia: Gen.   Indications for procedure: 41 y.o. yo female undergoing a laparoscopic roux en y gastric bypass and an upper endoscopy was requested to evaluate the anastomosis.  Description of procedure: After we have completed the new gastrojejunostomy, I scrubbed out and obtained the Olympus endoscope. I gently placed endoscope in the patient's oropharynx and gently glided it down the esophagus without any difficulty under direct visualization. Once I was in the gastric pouch, I insufflated the pouch was air. The pouch was approximately 5 cm in size. I was able to cannulate and advanced the scope through the gastrojejunostomy. Dr. Kieth Brightly had placed saline in the upper abdomen. Upon further insufflation of the gastric pouch there was no evidence of bubbles. Upon further inspection of the gastric pouch, the mucosa appeared normal. There is no evidence of any mucosal abnormality. The gastric pouch and Roux limb were decompressed. The width of the gastrojejunal anastomosis was at least 2.5 cm. The scope was withdrawn. The patient tolerated this portion of the procedure well. Please see Dr Amie Portland operative note for details regarding the laparoscopic roux-en-y gastric bypass.  Leighton Ruff. Redmond Pulling, MD, FACS General, Bariatric, & Minimally Invasive Surgery [facility], [place]

## 2016-06-09 NOTE — Discharge Instructions (Signed)

## 2016-06-09 NOTE — Anesthesia Postprocedure Evaluation (Addendum)
Anesthesia Post Note  Patient: Alicia Farmer  Procedure(s) Performed: Procedure(s) (LRB): LAPAROSCOPIC ROUX-EN-Y GASTRIC BYPASS WITH UPPER ENDOSCOPY (N/A)  Patient location during evaluation: PACU Anesthesia Type: General Level of consciousness: awake and alert Pain management: pain level controlled Vital Signs Assessment: post-procedure vital signs reviewed and stable Respiratory status: spontaneous breathing, nonlabored ventilation, respiratory function stable and patient connected to nasal cannula oxygen Cardiovascular status: blood pressure returned to baseline and stable Postop Assessment: no signs of nausea or vomiting Anesthetic complications: no       Last Vitals:  Vitals:   06/09/16 1330 06/09/16 1347  BP: (!) 129/91 133/88  Pulse: 91 89  Resp: 18 18  Temp:  36.5 C    Last Pain:  Vitals:   06/09/16 1350  TempSrc:   PainSc: 2                  Effie Berkshire

## 2016-06-09 NOTE — Interval H&P Note (Signed)
History and Physical Interval Note:  06/09/2016 6:59 AM  Alicia Farmer  has presented today for surgery, with the diagnosis of MORBID OBESITY  The various methods of treatment have been discussed with the patient and family. After consideration of risks, benefits and other options for treatment, the patient has consented to  Procedure(s): LAPAROSCOPIC ROUX-EN-Y GASTRIC BYPASS WITH UPPER ENDOSCOPY (N/A) as a surgical intervention .  The patient's history has been reviewed, patient examined, no change in status, stable for surgery.  I have reviewed the patient's chart and labs.  Questions were answered to the patient's satisfaction.     [name]

## 2016-06-10 LAB — CBC WITH DIFFERENTIAL/PLATELET
BASOS ABS: 0 10*3/uL (ref 0.0–0.1)
Basophils Relative: 0 %
EOS PCT: 0 %
Eosinophils Absolute: 0 10*3/uL (ref 0.0–0.7)
HEMATOCRIT: 36 % (ref 36.0–46.0)
Hemoglobin: 11.9 g/dL — ABNORMAL LOW (ref 12.0–15.0)
Lymphocytes Relative: 17 %
Lymphs Abs: 1.4 10*3/uL (ref 0.7–4.0)
MCH: 28.7 pg (ref 26.0–34.0)
MCHC: 33.1 g/dL (ref 30.0–36.0)
MCV: 86.7 fL (ref 78.0–100.0)
MONOS PCT: 8 %
Monocytes Absolute: 0.6 10*3/uL (ref 0.1–1.0)
NEUTROS PCT: 75 %
Neutro Abs: 6 10*3/uL (ref 1.7–7.7)
Platelets: 257 10*3/uL (ref 150–400)
RBC: 4.15 MIL/uL (ref 3.87–5.11)
RDW: 13.9 % (ref 11.5–15.5)
WBC: 8 10*3/uL (ref 4.0–10.5)

## 2016-06-10 LAB — COMPREHENSIVE METABOLIC PANEL
ALBUMIN: 3.6 g/dL (ref 3.5–5.0)
ALT: 26 U/L (ref 14–54)
AST: 20 U/L (ref 15–41)
Alkaline Phosphatase: 43 U/L (ref 38–126)
Anion gap: 7 (ref 5–15)
BILIRUBIN TOTAL: 0.5 mg/dL (ref 0.3–1.2)
BUN: 10 mg/dL (ref 6–20)
CO2: 26 mmol/L (ref 22–32)
CREATININE: 0.84 mg/dL (ref 0.44–1.00)
Calcium: 8.5 mg/dL — ABNORMAL LOW (ref 8.9–10.3)
Chloride: 105 mmol/L (ref 101–111)
GFR calc Af Amer: >60 mL/min (ref >60)
GFR calc non Af Amer: >60 mL/min (ref >60)
GLUCOSE: 103 mg/dL — AB (ref 65–99)
Potassium: 3.5 mmol/L (ref 3.5–5.1)
Sodium: 138 mmol/L (ref 135–145)
Total Protein: 6.6 g/dL (ref 6.5–8.1)

## 2016-06-10 MED ORDER — LABETALOL HCL 5 MG/ML IV SOLN
5.0000 mg | INTRAVENOUS | Status: DC | PRN
  Filled 2016-06-10: qty 4

## 2016-06-10 NOTE — Progress Notes (Signed)
Patient alert and oriented, Post op day 1.  Provided support and encouragement.  Encouraged pulmonary toilet, ambulation and small sips of liquids. Protein started at 8 am.  Patient completed over the 12 ounce requirement for clear liquids. All questions answered.  Will continue to monitor.

## 2016-06-10 NOTE — Progress Notes (Signed)
  Progress Note: Metabolic and Bariatric Surgery Service   Subjective: Persistent diffuse abdominal pain. No nausea, tolerated >10oz water and some shake, ambulating  Objective: Vital signs in last 24 hours: Temp:  [97.4 F (36.3 C)-98.4 F (36.9 C)] 98.4 F (36.9 C) (04/24 0955) Pulse Rate:  [80-101] 89 (04/24 0955) Resp:  [12-32] 20 (04/24 0955) BP: (119-157)/(75-120) 133/84 (04/24 0955) SpO2:  [92 %-100 %] 99 % (04/24 0955)    Intake/Output from previous day: 04/23 0701 - 04/24 0700 In: 4083.3 [P.O.:270; I.V.:3813.3] Out: -  Intake/Output this shift: No intake/output data recorded.  Lungs: CTAB  Cardiovascular: RRR  Abd: soft, ATTP, ND  Extremities: no edema  Neuro: AOx4  Lab Results: CBC   Recent Labs  06/09/16 1516 06/10/16 0505  WBC 10.1 8.0  HGB 12.8 11.9*  HCT 38.8 36.0  PLT 278 257   BMET  Recent Labs  06/09/16 1516 06/10/16 0505  NA  --  138  K  --  3.5  CL  --  105  CO2  --  26  GLUCOSE  --  103*  BUN  --  10  CREATININE 0.94 0.84  CALCIUM  --  8.5*   PT/INR No results for input(s): LABPROT, INR in the last 72 hours. ABG No results for input(s): PHART, HCO3 in the last 72 hours.  Invalid input(s): PCO2, PO2  Studies/Results:  Anti-infectives: Anti-infectives    Start     Dose/Rate Route Frequency Ordered Stop   06/09/16 0726  cefoTEtan in Dextrose 5% (CEFOTAN) 2-2.08 GM-% IVPB    Comments:  Williford, Peggy   : cabinet override      06/09/16 0726 06/09/16 0740   06/09/16 0626  cefoTEtan in Dextrose 5% (CEFOTAN) IVPB 2 g     2 g Intravenous On call to O.R. 06/09/16 0626 06/09/16 0740      Medications: Scheduled Meds: . enoxaparin (LOVENOX) injection  30 mg Subcutaneous Q12H  . pantoprazole (PROTONIX) IV  40 mg Intravenous QHS  . [START ON 06/11/2016] protein supplement shake  2 oz Oral Q2H  . scopolamine  1 patch Transdermal On Call to OR   Continuous Infusions: . sodium chloride 100 mL/hr at 06/10/16 0600   PRN  Meds:.oxyCODONE **AND** acetaminophen, acetaminophen, morphine injection, ondansetron (ZOFRAN) IV, simethicone  Assessment/Plan: Patient Active Problem List   Diagnosis Date Noted  . Obesity, Class II, BMI 35-39.9, with comorbidity 06/09/2016  . OSA (obstructive sleep apnea) 04/10/2016   s/p Procedure(s): LAPAROSCOPIC ROUX-EN-Y GASTRIC BYPASS WITH UPPER ENDOSCOPY 06/09/2016 -continue eras care -encourage ambulation -PO and IV pain meds  Disposition:  LOS: 1 day  The patient will be in the hospital for normal postop protocol  Mickeal Skinner, MD [phone] [facility]

## 2016-06-11 LAB — CBC WITH DIFFERENTIAL/PLATELET
Basophils Absolute: 0 10*3/uL (ref 0.0–0.1)
Basophils Relative: 0 %
EOS PCT: 0 %
Eosinophils Absolute: 0 10*3/uL (ref 0.0–0.7)
HCT: 34.3 % — ABNORMAL LOW (ref 36.0–46.0)
Hemoglobin: 11.1 g/dL — ABNORMAL LOW (ref 12.0–15.0)
LYMPHS PCT: 40 %
Lymphs Abs: 2.9 10*3/uL (ref 0.7–4.0)
MCH: 27.7 pg (ref 26.0–34.0)
MCHC: 32.4 g/dL (ref 30.0–36.0)
MCV: 85.5 fL (ref 78.0–100.0)
MONO ABS: 0.6 10*3/uL (ref 0.1–1.0)
MONOS PCT: 8 %
NEUTROS ABS: 3.8 10*3/uL (ref 1.7–7.7)
Neutrophils Relative %: 52 %
PLATELETS: 244 10*3/uL (ref 150–400)
RBC: 4.01 MIL/uL (ref 3.87–5.11)
RDW: 13.9 % (ref 11.5–15.5)
WBC: 7.4 10*3/uL (ref 4.0–10.5)

## 2016-06-11 MED FILL — oxyCODONE HCL 5 MG/5ML SOLN: 5 | 2 days supply | Qty: 100 | Fill #0

## 2016-06-11 NOTE — Progress Notes (Signed)
Patient alert and oriented, Post op day 2.  Provided support and encouragement.  Encouraged pulmonary toilet, ambulation and small sips of liquids.  Patient tolerating protein without difficulty. Discussed the addition of Tylenol intermittently with Roxicodone to offer pain control versus IV pain medication.  All questions answered.  Will continue to monitor.

## 2016-06-11 NOTE — Progress Notes (Signed)
  Progress Note: Metabolic and Bariatric Surgery Service   Chief Complaint: obesity  Subjective: Pain much improved, ambulating well. Still using IV meds overnigh  Objective: Vital signs in last 24 hours: Temp:  [97.8 F (36.6 C)-99.4 F (37.4 C)] 97.8 F (36.6 C) (04/25 0527) Pulse Rate:  [61-89] 61 (04/25 0527) Resp:  [16-20] 16 (04/25 0527) BP: (94-133)/(60-84) 131/73 (04/25 0527) SpO2:  [93 %-99 %] 93 % (04/25 0527) Weight:  [124.6 kg (274 lb 11.2 oz)] 124.6 kg (274 lb 11.2 oz) (04/25 0049)    Intake/Output from previous day: 04/24 0701 - 04/25 0700 In: 3000 [P.O.:600; I.V.:2400] Out: -  Intake/Output this shift: No intake/output data recorded.  Lungs: CTAB  Cardiovascular: RRR  Abd: soft, NT, ND  Extremities: no edema  Neuro: AOx4  Lab Results: CBC   Recent Labs  06/10/16 0505 06/11/16 0451  WBC 8.0 7.4  HGB 11.9* 11.1*  HCT 36.0 34.3*  PLT 257 244   BMET  Recent Labs  06/09/16 1516 06/10/16 0505  NA  --  138  K  --  3.5  CL  --  105  CO2  --  26  GLUCOSE  --  103*  BUN  --  10  CREATININE 0.94 0.84  CALCIUM  --  8.5*   PT/INR No results for input(s): LABPROT, INR in the last 72 hours. ABG No results for input(s): PHART, HCO3 in the last 72 hours.  Invalid input(s): PCO2, PO2  Studies/Results:  Anti-infectives: Anti-infectives    Start     Dose/Rate Route Frequency Ordered Stop   06/09/16 0726  cefoTEtan in Dextrose 5% (CEFOTAN) 2-2.08 GM-% IVPB    Comments:  Williford, Peggy   : cabinet override      06/09/16 0726 06/09/16 0740   06/09/16 0626  cefoTEtan in Dextrose 5% (CEFOTAN) IVPB 2 g     2 g Intravenous On call to O.R. 06/09/16 0626 06/09/16 0740      Medications: Scheduled Meds: . enoxaparin (LOVENOX) injection  30 mg Subcutaneous Q12H  . pantoprazole (PROTONIX) IV  40 mg Intravenous QHS  . protein supplement shake  2 oz Oral Q2H   Continuous Infusions: . sodium chloride 100 mL/hr at 06/10/16 2105   PRN  Meds:.oxyCODONE **AND** acetaminophen, acetaminophen, labetalol, morphine injection, ondansetron (ZOFRAN) IV, simethicone  Assessment/Plan: Patient Active Problem List   Diagnosis Date Noted  . Obesity, Class II, BMI 35-39.9, with comorbidity 06/09/2016  . OSA (obstructive sleep apnea) 04/10/2016   s/p Procedure(s): LAPAROSCOPIC ROUX-EN-Y GASTRIC BYPASS WITH UPPER ENDOSCOPY 06/09/2016 -no IV meds for the morning -as long as pain is still under control should be able to go home  Disposition:  LOS: 2 days  The patient should be discharged from the hospital today  Mickeal Skinner, MD [phone] [facility]

## 2016-06-11 NOTE — Progress Notes (Signed)
Patient alert and oriented, pain is controlled. Patient is tolerating fluids, advanced to protein shake today, patient is tolerating well. Reviewed Gastric Bypass discharge instructions with patient and patient is able to articulate understanding. Provided information on BELT program, Support Group and [facility] outpatient pharmacy. All questions answered, will continue to monitor.    

## 2016-06-11 NOTE — Progress Notes (Signed)
Pt's vitals are WNL, tolerating diet and pain is under control. Discharged to home.

## 2016-06-17 NOTE — Op Note (Signed)
Preop Diagnosis: Obesity Class III  Postop Diagnosis: same  Procedure performed: laparoscopic Roux en Y gastric bypass  Assitant: Greer Pickerel  Indications:  The patient is a 41 y.o. year-old morbidly obese female who has been followed in the Bariatric Clinic as an outpatient. This patient was diagnosed with morbid obesity with a BMI of Body mass index is 40.57 kg/m. and significant co-morbidities including hypertension.  The patient was counseled extensively in the Bariatric Outpatient Clinic and after a thorough explanation of the risks and benefits of surgery (including death from complications, bowel leak, infection such as peritonitis and/or sepsis, internal hernia, bleeding, need for blood transfusion, bowel obstruction, organ failure, pulmonary embolus, deep venous thrombosis, wound infection, incisional hernia, skin breakdown, and others entailed on the consent form) and after a compliant diet and exercise program, the patient was scheduled for an elective laparoscopic sleeve gastrectomy.  Description of Operation:  Following informed consent, the patient was taken to the operating room and placed on the operating table in the supine position.  She had previously received prophylactic antibiotics and subcutaneous heparin for DVT prophylaxis in the pre-op holding area.  After induction of general endotracheal anesthesia by the anesthesiologist, the patient underwent placement of sequential compression devices, Foley catheter and an oro-gastric tube.  A timeout was confirmed by the surgery and anesthesia teams.  The patient was adequately padded at all pressure points and placed on a footboard to prevent slippage from the OR table during extremes of position during surgery.  She underwent a routine sterile prep and drape of her entire abdomen.    Next, A transverse incision was made under the left subcostal area and a 36mm optical viewing trocar was introduced into the peritoneal cavity.  Pneumoperitoneum was applied with a high flow and low pressure. A laparoscope was inserted to confirm placement. A extraperitoneal block was then placed at the lateral abdominal wall using exparel diluted with marcaine . 5 additional trocars were placed: 1 50mm trocar to the left of the midline. 1 additional 47mm trocar in the left lateral area, 1 98mm trocar in the right mid abdomen, and 1 68mm trocar in the right subcostal area.  The greater omentum was flipped over the transverse colon and under the left lobe of the liver. The ligament of trietz was identified. 30cm of jejunum was measured starting from the ligament of Trietz. The mesentery was checked to ensure mobility. Next, a 17mm 2-26mm tristapler was used to divide the jejunum at this location. The harmonic scalpel was used to divide the mesentery down to the origin. A 1/2" penrose was sutured to the distal side. 100cm of jejunum was measured starting at the division. 2-0 silk was used to appose the biliary limb to the 100cm mark of jejunum in 2 places. Enterotomies were made in the biliary and common channels and a 42mm 2-3 tristapler was used to create the J-J anastomosis. A 2-0 silk was used to appose the enterotomy edges and a 683mm 2-3 tristapler was used to close the enterotomy. An anti-obstruction 2-0 silk suture was placed. Next, the mesenteric defect was closed with a 2-0 silk in running fashion.The J-J appeared patent and in neutral position.  Next, the omentum was divided using the Harmonic scalpel. The patient was placed in steep Reverse Trendelenberg position. A Nathanson retracted was placed through a subxiphoid incision and used to retract the liver. The fat pad over the fundus was incised to free the fundus. Next, a position along the lesser curve 6cm from  GE junction was identified. The pars flaccida was entered and the fat over the lesser curve divided to enter the lesser sac. Multiple 57mm 3-62mm tristaple firings were peformed to create a  6cm pouch. The Roux limb was identified using the placed penrose and brought up to the stomach in antecolic fashion. The limb was inspected to ensure a neutral position. A 2-0 vicryl suture was then used to create a posterior layer connecting the stomach to the Roux limb jejunum in running fashion. Next cautery was used to create an enterotomy along the medial aspect of this suture line and Harmonic scalpel used to create gastotomy. A 53mm 3-35mm tristapler was then used to create a 25-38mm anastomosis. 2 2-0 vicryl sutures were used in running fashion to close the gastrotomy. Finally, a 2-0 vicryl suture was used to close an anterior layer of stomach and jejunum over the anastomosis in running fashion. The penrose was removed from the Roux limb.  The assistant then went and performed an upper endoscopy and leak test. No bubbles were seen and the pouch and limb distended appropriately. The limb and pouch were deflated, the endoscope was removed. Hemostasis was ensured. Pneumoperitoneum was evacuated, all ports were removed and all incisions closed with 4-0 monocryl suture in subcuticular fashion. Glue was put in place for dressing. The patient awoke from anesthesia and was brought to pacu in stable condition. All counts were correct.  Specimens:  None  Local Anesthesia: 50 ml Exparel: 0.5% Marcaine Mix  Post-Op Plan:       Pain Management: PO, prn      Antibiotics: Prophylactic      Anticoagulation: Prophylactic, Starting now      Post Op Studies/Consults: Not applicable      Intended Discharge: within 48h      Intended Outpatient Follow-Up: Two Week      Intended Outpatient Studies: Not Applicable      Other: Not Applicable   [name]

## 2016-06-17 NOTE — Discharge Summary (Signed)
Physician Discharge Summary  NANDI TONNESEN NFA:[id number] DOB: Jul 17, 1975 DOA: 06/09/2016  PCP: [facility]  Admit date: 06/09/2016 Discharge date: 06/17/2016  Recommendations for Outpatient Follow-up:  1.  (include homehealth, outpatient follow-up instructions, specific recommendations for PCP to follow-up on, etc.)  Follow-up Information    Mickeal Skinner, MD. Go on 07/02/2016.   Specialty:  General Surgery Why:  @ 469 AM Contact information: [address] [place] [address] [phone]        Mickeal Skinner, MD Follow up.   Specialty:  General Surgery Contact information: [place]  [phone]          Discharge Diagnoses:  Active Problems:   Obesity, Class II, BMI 35-39.9, with comorbidity   Surgical Procedure: Laparoscopic Sleeve Gastrectomy, upper endoscopy  Discharge Condition: Good Disposition: Home  Diet recommendation: Postoperative sleeve gastrectomy diet (liquids only)  Filed Weights   06/09/16 0606 06/09/16 0654 06/11/16 0049  Weight: 119.2 kg (262 lb 12.8 oz) 119.2 kg (262 lb 13 oz) 124.6 kg (274 lb 11.2 oz)     Hospital Course:  The patient was admitted after undergoing Roux-en-Y gastric bypass. POD 0 she ambulated well. POD 1 she was started on the water diet protocol and tolerated 200 ml in the first shift. Once meeting the water amount she was advanced to bariatric protein shakes which they tolerated and were discharged home POD 2.  Treatments: surgery: Roux-en-Y gastric bypass  Discharge Instructions  Discharge Instructions    Ambulate hourly while awake    Complete by:  As directed    Call MD for:  difficulty breathing, headache or visual disturbances    Complete by:  As directed    Call MD for:  persistant dizziness or light-headedness    Complete by:  As directed    Call MD for:  persistant nausea and vomiting    Complete by:  As directed    Call MD  for:  redness, tenderness, or signs of infection (pain, swelling, redness, odor or green/yellow discharge around incision site)    Complete by:  As directed    Call MD for:  severe uncontrolled pain    Complete by:  As directed    Call MD for:  temperature >101 F    Complete by:  As directed    Diet bariatric full liquid    Complete by:  As directed    Discharge wound care:    Complete by:  As directed    Remove Bandaids tomorrow, ok to shower tomorrow. Steristrips may fall off in 1-3 weeks.   Incentive spirometry    Complete by:  As directed    Perform hourly while awake     Allergies as of 06/11/2016      Reactions   Meloxicam Nausea And Vomiting      Medication List    TAKE these medications   ALPRAZolam 0.5 MG tablet Commonly known as:  XANAX Take 0.5 mg by mouth at bedtime.   citalopram 40 MG tablet Commonly known as:  CELEXA Take 40 mg by mouth daily.   levothyroxine 25 MCG tablet Commonly known as:  SYNTHROID, LEVOTHROID Take 25 mcg by mouth daily before breakfast.   zolpidem 10 MG tablet Commonly known as:  AMBIEN Take 10 mg by mouth at bedtime as needed for sleep.      Follow-up Information    Mickeal Skinner, MD. Go on 07/02/2016.   Specialty:  General Surgery Why:  @ 478 AM Contact information: [address] [place] [address] [phone]        Mickeal Skinner, MD Follow up.   Specialty:  General Surgery Contact information: [place]  [phone]            The results of significant diagnostics from this hospitalization (including imaging, microbiology, ancillary and laboratory) are listed below for reference.    Significant Diagnostic Studies: No results found.  Labs: Basic Metabolic Panel: No results for input(s): NA, K, CL, CO2, GLUCOSE, BUN, CREATININE, CALCIUM, MG, PHOS in the last 168 hours. Liver Function Tests: No results for input(s): AST, ALT, ALKPHOS,  BILITOT, PROT, ALBUMIN in the last 168 hours.  CBC:  Recent Labs Lab 06/11/16 0451  WBC 7.4  NEUTROABS 3.8  HGB 11.1*  HCT 34.3*  MCV 85.5  PLT 244    CBG: No results for input(s): GLUCAP in the last 168 hours.  Active Problems:   Obesity, Class II, BMI 35-39.9, with comorbidity   Time coordinating discharge: 32min

## 2016-06-19 ENCOUNTER — Telehealth (HOSPITAL_COMMUNITY): Payer: Self-pay

## 2016-06-19 NOTE — Telephone Encounter (Signed)
Called patient for follow up.  Message went to voicemail which was ful unable to leave message.

## 2016-06-24 ENCOUNTER — Encounter: Payer: BLUE CROSS/BLUE SHIELD | Attending: General Surgery | Admitting: Registered"

## 2016-06-24 DIAGNOSIS — Z6841 Body Mass Index (BMI) 40.0 and over, adult: Secondary | ICD-10-CM | POA: Insufficient documentation

## 2016-06-24 DIAGNOSIS — Z713 Dietary counseling and surveillance: Secondary | ICD-10-CM | POA: Insufficient documentation

## 2016-06-24 DIAGNOSIS — E669 Obesity, unspecified: Secondary | ICD-10-CM

## 2016-06-25 NOTE — Progress Notes (Signed)
Bariatric Class:  Appt start time: 1530 end time:  1630.  2 Week Post-Operative Nutrition Class  Patient was seen on 06/24/2016 for Post-Operative Nutrition education [facility].   Surgery date: 06/09/2016 Surgery type: RYGB Start weight at [facility]: 285.1 Weight today: 251.8 Weight change: 33.3  TANITA  BODY COMP RESULTS  06/24/2016   BMI (kg/m^2) 37.2   Fat Mass (lbs) 132.0   Fat Free Mass (lbs) 119.8   Total Body Water (lbs) 88.4   The following the learning objectives were met by the patient during this course:  Identifies Phase 3A (Soft, High Proteins) Dietary Goals and will begin from 2 weeks post-operatively to 2 months post-operatively  Identifies appropriate sources of fluids and proteins   States protein recommendations and appropriate sources post-operatively  Identifies the need for appropriate texture modifications, mastication, and bite sizes when consuming solids  Identifies appropriate multivitamin and calcium sources post-operatively  Describes the need for physical activity post-operatively and will follow MD recommendations  States when to call healthcare provider regarding medication questions or post-operative complications  Handouts given during class include:  Phase 3A: Soft, High Protein Diet Handout  Follow-Up Plan: Patient will follow-up at [facility] in 6 weeks for 2 month post-op nutrition visit for diet advancement per MD.

## 2016-07-18 NOTE — Addendum Note (Signed)
Addendum  created 07/18/16 1246 by Effie Berkshire, MD   Sign clinical note

## 2016-08-05 ENCOUNTER — Encounter: Payer: Self-pay | Admitting: Registered"

## 2016-08-05 ENCOUNTER — Encounter: Payer: BLUE CROSS/BLUE SHIELD | Attending: General Surgery | Admitting: Registered"

## 2016-08-05 DIAGNOSIS — E669 Obesity, unspecified: Secondary | ICD-10-CM

## 2016-08-05 DIAGNOSIS — Z713 Dietary counseling and surveillance: Secondary | ICD-10-CM | POA: Insufficient documentation

## 2016-08-05 DIAGNOSIS — Z6841 Body Mass Index (BMI) 40.0 and over, adult: Secondary | ICD-10-CM | POA: Insufficient documentation

## 2016-08-05 NOTE — Patient Instructions (Addendum)
-   Add in protein shake as breakfast option to increase protein intake to at least 60g per day.   - Track food and drink intake.   - Increase fluid intake to at least 64 oz per day. Incorporate sugar-free popsicles, sugar-free jello, unsweetened soy milk.   - Create a schedule for eating and taking vitamins/supplements.

## 2016-08-05 NOTE — Progress Notes (Signed)
Follow-up visit:  8 Weeks Post-Operative RYGB Surgery  Medical Nutrition Therapy:  Appt start time: 5:00 end time:  5:33.  Primary concerns today: Post-operative Bariatric Surgery Nutrition Management.  Non scale victories: none stated  Surgery date: 06/09/2016 Surgery type: RYGB Start weight at [facility]: 285.3 lbs Weight today: pt declined Weight change: N/A Total weight lost: N/A Weight loss goal: none stated   TANITA  BODY COMP RESULTS  06/24/2016 08/05/2016   BMI (kg/m^2) 37.2 Pt declined   Fat Mass (lbs) 132.0    Fat Free Mass (lbs) 119.8    Total Body Water (lbs) 88.4     Pt arrives 15 min late and states she's been feeling tired. Pt states some things burn especially when drinking water. Pt is not meeting protein or fluid needs and was not advanced to non-starchy vegetables at this time. Pt will increase protein and fluid needs to be reassessed in 2 weeks. Pt states when she stopped tracking her food is when her eating started going downhill and protein intake decreased.    Preferred Learning Style:   No preference indicated   Learning Readiness:   Ready  Change in progress  24-hr recall: B (AM): skips sometimes Snk (AM): none  L (PM): [employer]-1/2 chicken wrap (19g-chicken, lettuce, balsamic vinegar, whole wheat tortilla, cheese) Snk (PM): none  D (PM): 1/2 tuna sandwich (14g-tuna, low fat mayo, potato bread) Snk (PM): none  Fluid intake: water, about 35 oz Estimated total protein intake: about 30g +  Medications: See list Supplementation: Celebrate + 3 Ca supp  Using straws: no Drinking while eating: mostly no Having you been chewing well: yes Chewing/swallowing difficulties: no Changes in vision: no Changes to mood/headaches: some mood changes Hair loss/Changes to skin/Changes to nails: pale skin Any difficulty focusing or concentrating: no Sweating: no Dizziness/Lightheaded: no Palpitations: no  Carbonated beverages: sips here and  there N/V/D/C/GAS: no Abdominal Pain: some with water Dumping syndrome: no Last Lap-Band fill: N/A  Recent physical activity:  none  Progress Towards Goal(s):  In progress.  Handouts given during visit include:  Phase IIIA: High Protein diet   Nutritional Diagnosis:  NI-5.7.1 Inadequate protein intake As related to bariatric post-op recommendations.  As evidenced by dietary recall of less than 60g protein.    Intervention:  Nutrition counseling and education on the importance of meeting protein and fluid recommendations and how to incorporate them into daily regimen.   Goals: - Add in protein shake as breakfast option to increase protein intake to at least 60g per day.  - Track food and drink intake.  - Increase fluid intake to at least 64 oz per day. Incorporate sugar-free popsicles, sugar-free jello, unsweetened soy milk.  - Create a schedule for eating and taking vitamins/supplements.   Teaching Method Utilized:  Visual Auditory  Barriers to learning/adherence to lifestyle change: pt states her daily schedule is unpredictable  Demonstrated degree of understanding via:  Teach Back   Monitoring/Evaluation:  Dietary intake, exercise, lap band fills, and body weight. Follow up in 2 weeks for 10 week post-op visit.

## 2016-08-25 ENCOUNTER — Telehealth: Payer: Self-pay | Admitting: Registered"

## 2016-08-25 ENCOUNTER — Ambulatory Visit: Payer: Self-pay | Admitting: Registered"

## 2016-08-25 NOTE — Telephone Encounter (Signed)
Pt did not show up for follow-up appointment to transition from Phase IIIA diet (high protein) to Phase IIIB diet (high protein + non-starchy vegetables).  RD called pt. Pt was not home and RD left a message for pt to return call and reschedule.

## 2017-07-14 ENCOUNTER — Ambulatory Visit: Payer: Medicaid Other | Admitting: Nurse Practitioner

## 2017-07-14 ENCOUNTER — Encounter (INDEPENDENT_AMBULATORY_CARE_PROVIDER_SITE_OTHER): Payer: Self-pay

## 2017-07-14 ENCOUNTER — Encounter: Payer: Self-pay | Admitting: Nurse Practitioner

## 2017-07-14 VITALS — BP 104/76 | HR 72 | Ht 69.0 in | Wt 173.0 lb

## 2017-07-14 DIAGNOSIS — R194 Change in bowel habit: Secondary | ICD-10-CM | POA: Diagnosis not present

## 2017-07-14 DIAGNOSIS — R1013 Epigastric pain: Secondary | ICD-10-CM | POA: Diagnosis not present

## 2017-07-14 MED ORDER — NA SULFATE-K SULFATE-MG SULF 17.5-3.13-1.6 GM/177ML PO SOLN: ORAL | 0 refills | Status: DC

## 2017-07-14 NOTE — Progress Notes (Signed)
Chief Complaint: abdominal pain, white stool. Wants EGD and colonoscopy  Referring Provider:    Irven Shelling, NP    ASSESSMENT AND PLAN;    72.  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  No NSAID use. - it is not unreasonable to proceed with upper endoscopy.  I am not empirically treating with acid blocker at this point since the pain is so episodic, will await EGD findings. The risks and benefits of EGD were discussed and the patient agrees to proceed.   2.  Precancerous polyps in father (under age of 47). Patient wants a colonoscopy.  She had change in stool color (white) a couple of weeks ago but it resolved.  I did explain that white stools would imply more of a biliary obstruction which I do not think that she has.  No dark urine,  Itching or jaundice.  -Patient will be scheduled for a colonoscopy with possible polypectomy.  The risks and benefits of the procedure were discussed and the patient agrees to proceed.    HPI:    Patient is a 42 year old female, status post Roux-en-Y one year ago with CCS. She is  referred by PCP for evaluation of upper abdominal discomfort.  Last month she experienced a change in bowel color for approximately 2 weeks.  Stools were white, they have since returned to normal.  No other bowel changes.  No blood in her stool.  No family history colon cancer.  Her father had precancerous polyps in his 19s and she would like a colonoscopy.  Patient gives a 36-month history of nonradiating epigastric burning.  Burning is not related to eating.  Besides may last for hours . No aggravating nor alleviating factors.  The burning is episodic, may go days without it.  She has had maybe 20 episodes in the last 6 months.  No NSAID use.  No black stools.  No associated nausea or vomiting.  She reports appropriate weight loss after Roux-en-Y.   Alicia Farmer also has concern about change in stool color  last month.  For 2 weeks she had white stools then color returned to normal and stools have been normal since.  No other bowel changes.  No rectal bleeding.  No lower abdominal pain. No dark urine.   Past Medical History:  Diagnosis Date  . Anxiety   . Arthritis    in both big toes  . Chronic pain syndrome   . Depressive disorder   . Endometriosis   . Hypothyroidism   . Insomnia   . Nicotine dependence   . Obesity    had gastric bypass  . Sleep apnea    no CPAP     Past Surgical History:  Procedure Laterality Date  . ANTERIOR CRUCIATE LIGAMENT REPAIR Left   . ARTHROPLASTY  2018   1st metatarsal of left and right foot   . GASTRIC ROUX-EN-Y N/A 06/09/2016   Procedure: LAPAROSCOPIC ROUX-EN-Y GASTRIC BYPASS WITH UPPER ENDOSCOPY;  Surgeon: [name], MD;  Location: [facility];  Service: General;  Laterality: N/A;  . PLACEMENT OF BREAST IMPLANTS  2006  . VAGINAL HYSTERECTOMY     partial, has one ovary left   Family History  Problem Relation Age of Onset  . Hypertension Other   . Hyperlipidemia Other   . Diabetes Mother   . Hypertension Mother   . Colon polyps Father        pre-cancerous  .  Esophageal cancer Maternal Grandmother        non-smoker  . Glaucoma Maternal Uncle   . Colon cancer Neg Hx   . Rectal cancer Neg Hx    Social History   Tobacco Use  . Smoking status: Former Smoker    Years: 20.00    Types: Cigarettes    Start date: 04/10/1996    Last attempt to quit: 02/18/2016    Years since quitting: 1.4  . Smokeless tobacco: Never Used  Substance Use Topics  . Alcohol use: Yes    Comment: social  . Drug use: No   Current Outpatient Medications  Medication Sig Dispense Refill  . ALPRAZolam (XANAX) 0.5 MG tablet Take 0.5 mg by mouth at bedtime.     . Calcium 500 MG CHEW Chew by mouth. Take 1 chew twice daily    . citalopram (CELEXA) 40 MG tablet Take 40 mg by mouth daily.    Marland Kitchen levothyroxine (SYNTHROID, LEVOTHROID) 25 MCG tablet Take 25 mcg by mouth  daily before breakfast.    . Multiple Vitamins-Minerals (CELEBRATE MULTI-COMPLETE 18) CHEW Chew by mouth. These are special chews for gastric by pass patients  Chew one three times a day    . Vitamin D, Ergocalciferol, (DRISDOL) 50000 units CAPS capsule Take 50,000 Units by mouth every 7 (seven) days.    Marland Kitchen zolpidem (AMBIEN) 10 MG tablet Take 10 mg by mouth at bedtime as needed for sleep.     No current facility-administered medications for this visit.    Allergies  Allergen Reactions  . Meloxicam Nausea And Vomiting     Review of Systems: Positive for anxiety . All other systems reviewed and negative except where noted in HPI.   Physical Exam:    Wt Readings from Last 3 Encounters:  07/14/17 173 lb (78.5 kg)  06/25/16 251 lb 12.8 oz (114.2 kg)  06/11/16 274 lb 11.2 oz (124.6 kg)    BP 104/76   Pulse 72   Ht 5\' 9"  (1.753 m)   Wt 173 lb (78.5 kg)   BMI 25.55 kg/m  Constitutional:  Well-developed, white female in no acute distress. Psychiatric: Normal mood and affect. Behavior is normal. EENT: Pupils normal.  Conjunctivae are normal. No scleral icterus. Neck supple.  Cardiovascular: Normal rate, regular rhythm. No edema Pulmonary/chest: Effort normal and breath sounds normal. No wheezing, rales or rhonchi. Abdominal: Soft, nondistended. Nontender. Bowel sounds active throughout. There are no masses palpable. No hepatomegaly. Neurological: Alert and oriented to person place and time. Skin: Skin is warm and dry. No rashes noted.  Tye Savoy, NP  07/14/2017, 11:27 AM  Cc: Irven Shelling, NP

## 2017-07-14 NOTE — Patient Instructions (Addendum)
If you are age 44 or older, your body mass index should be between 23-30. Your Body mass index is 25.55 kg/m. If this is out of the aforementioned range listed, please consider follow up with your Primary Care Provider.  If you are age 3 or younger, your body mass index should be between 19-25. Your Body mass index is 25.55 kg/m. If this is out of the aformentioned range listed, please consider follow up with your Primary Care Provider.   You have been scheduled for an endoscopy and colonoscopy. Please follow the written instructions given to you at your visit today. Please pick up your prep supplies at the pharmacy within the next 1-3 days. If you use inhalers (even only as needed), please bring them with you on the day of your procedure. Your physician has requested that you go to www.startemmi.com and enter the access code given to you at your visit today. This web site gives a general overview about your procedure. However, you should still follow specific instructions given to you by our office regarding your preparation for the procedure.  We have sent the following medications to your pharmacy for you to pick up at your convenience: Suprep  Thank you for choosing me and [facility].   Tye Savoy, NP

## 2017-07-15 ENCOUNTER — Telehealth: Payer: Self-pay

## 2017-07-15 ENCOUNTER — Encounter: Payer: Self-pay | Admitting: Nurse Practitioner

## 2017-07-22 NOTE — Progress Notes (Signed)
Reviewed and agree with documentation and assessment and plan. K. Veena Altheia Shafran , MD   

## 2017-07-29 ENCOUNTER — Other Ambulatory Visit: Payer: Self-pay

## 2017-07-29 ENCOUNTER — Ambulatory Visit (AMBULATORY_SURGERY_CENTER): Payer: Medicaid Other | Admitting: Gastroenterology

## 2017-07-29 ENCOUNTER — Encounter: Payer: Self-pay | Admitting: Gastroenterology

## 2017-07-29 VITALS — BP 118/71 | HR 71 | Temp 97.5°F | Resp 19 | Ht 69.0 in | Wt 173.0 lb

## 2017-07-29 DIAGNOSIS — K2970 Gastritis, unspecified, without bleeding: Secondary | ICD-10-CM | POA: Diagnosis not present

## 2017-07-29 DIAGNOSIS — R1013 Epigastric pain: Secondary | ICD-10-CM | POA: Diagnosis not present

## 2017-07-29 DIAGNOSIS — Z8371 Family history of colonic polyps: Secondary | ICD-10-CM | POA: Diagnosis not present

## 2017-07-29 DIAGNOSIS — D120 Benign neoplasm of cecum: Secondary | ICD-10-CM

## 2017-07-29 DIAGNOSIS — D126 Benign neoplasm of colon, unspecified: Secondary | ICD-10-CM | POA: Diagnosis not present

## 2017-07-29 DIAGNOSIS — K635 Polyp of colon: Secondary | ICD-10-CM | POA: Diagnosis not present

## 2017-07-29 DIAGNOSIS — D122 Benign neoplasm of ascending colon: Secondary | ICD-10-CM

## 2017-07-29 DIAGNOSIS — D123 Benign neoplasm of transverse colon: Secondary | ICD-10-CM

## 2017-07-29 DIAGNOSIS — K2990 Gastroduodenitis, unspecified, without bleeding: Secondary | ICD-10-CM | POA: Diagnosis not present

## 2017-07-29 DIAGNOSIS — R194 Change in bowel habit: Secondary | ICD-10-CM

## 2017-07-29 MED ORDER — SODIUM CHLORIDE 0.9 % IV SOLN: 500.0000 mL | Freq: Once | INTRAVENOUS | Status: AC

## 2017-07-29 MED ORDER — SUCRALFATE 1 GM/10ML PO SUSP: 1.0000 g | Freq: Three times a day (TID) | ORAL | 5 refills | Status: DC

## 2017-07-29 MED ORDER — OMEPRAZOLE 40 MG PO CPDR: 40.0000 mg | DELAYED_RELEASE_CAPSULE | Freq: Every day | ORAL | 3 refills | Status: AC

## 2017-07-29 NOTE — Op Note (Signed)
[facility] Patient Name: Alicia Farmer Procedure Date: 07/29/2017 3:28 PM MRN: [id number] Endoscopist: Mauri Pole , MD Age: 42 Referring MD:  Date of Birth: 09-20-1975 Gender: Female Account #: [id number] Procedure:                Upper GI endoscopy Indications:              Epigastric abdominal pain Medicines:                Monitored Anesthesia Care Procedure:                Pre-Anesthesia Assessment:                           - Prior to the procedure, a History and Physical                            was performed, and patient medications and                            allergies were reviewed. The patient's tolerance of                            previous anesthesia was also reviewed. The risks                            and benefits of the procedure and the sedation                            options and risks were discussed with the patient.                            All questions were answered, and informed consent                            was obtained. Prior Anticoagulants: The patient has                            taken no previous anticoagulant or antiplatelet                            agents. ASA Grade Assessment: II - A patient with                            mild systemic disease. After reviewing the risks                            and benefits, the patient was deemed in                            satisfactory condition to undergo the procedure.                           After obtaining informed consent, the endoscope was  passed under direct vision. Throughout the                            procedure, the patient's blood pressure, pulse, and                            oxygen saturations were monitored continuously. The                            Endoscope was introduced through the mouth, and                            advanced to the jejunum. The upper GI endoscopy was                            accomplished without  difficulty. The patient                            tolerated the procedure well. Scope In: Scope Out: Findings:                 LA Grade B (one or more mucosal breaks greater than                            5 mm, not extending between the tops of two mucosal                            folds) esophagitis with no bleeding was found 34 to                            36 cm from the incisors.                           Evidence of a Roux-en-Y gastrojejunostomy was                            found. The gastrojejunal anastomosis was                            characterized by ulceration. This was traversed.                            The pouch-to-jejunum limb was characterized by                            erythema and ulceration. The jejunojejunal                            anastomosis was characterized by erosion, erythema                            and ulceration. Biopsies were taken with a cold  forceps for histology from gastric pouch and                            gastrojejunal anatomosis site.                           The examined jejunum was normal. Complications:            No immediate complications. Estimated Blood Loss:     Estimated blood loss was minimal. Impression:               - LA Grade B reflux esophagitis.                           - Roux-en-Y gastrojejunostomy with gastrojejunal                            anastomosis characterized by ulceration. Biopsied.                           - Normal examined jejunum. Recommendation:           - Resume previous diet.                           - Continue present medications.                           - No aspirin, ibuprofen, naproxen, or other                            non-steroidal anti-inflammatory drugs.                           - Use Prilosec (omeprazole) 40 mg PO daily.                           - Use sucralfate suspension 1 gram PO QID for 2                            months. Mauri Pole,  MD 07/29/2017 4:18:09 PM This report has been signed electronically.

## 2017-07-29 NOTE — Progress Notes (Signed)
Called to room to assist during endoscopic procedure.  Patient ID and intended procedure confirmed with present staff. Received instructions for my participation in the procedure from the performing physician.  

## 2017-07-29 NOTE — Progress Notes (Signed)
I have reviewed the patient's medical history in detail and updated the computerized patient record.

## 2017-07-29 NOTE — Progress Notes (Signed)
Report to PACU, RN, vss, BBS= Clear.  

## 2017-07-29 NOTE — Op Note (Addendum)
[facility] Patient Name: Alicia Farmer Procedure Date: 07/29/2017 3:27 PM MRN: [id number] Endoscopist: Mauri Pole , MD Age: 42 Referring MD:  Date of Birth: 25-Apr-1975 Gender: Female Account #: [id number] Procedure:                Colonoscopy Indications:              Colon cancer screening in patient with 1st-degree                            relative having advanced adenoma of the colon                            before age 58, Colon cancer screening in patient                            with multiple 1st-degree relatives having advanced                            adenomas of the colon. Change in bowel habits. Medicines:                Monitored Anesthesia Care Procedure:                Pre-Anesthesia Assessment:                           - Prior to the procedure, a History and Physical                            was performed, and patient medications and                            allergies were reviewed. The patient's tolerance of                            previous anesthesia was also reviewed. The risks                            and benefits of the procedure and the sedation                            options and risks were discussed with the patient.                            All questions were answered, and informed consent                            was obtained. Prior Anticoagulants: The patient has                            taken no previous anticoagulant or antiplatelet                            agents. ASA Grade Assessment: II - A patient with  mild systemic disease. After reviewing the risks                            and benefits, the patient was deemed in                            satisfactory condition to undergo the procedure.                           After obtaining informed consent, the colonoscope                            was passed under direct vision. Throughout the                            procedure, the  patient's blood pressure, pulse, and                            oxygen saturations were monitored continuously. The                            Colonoscope was introduced through the anus and                            advanced to the the cecum, identified by                            appendiceal orifice and ileocecal valve. The                            colonoscopy was performed without difficulty. The                            patient tolerated the procedure well. The quality                            of the bowel preparation was excellent. Scope In: 3:55:35 PM Scope Out: 4:11:49 PM Scope Withdrawal Time: 0 hours 12 minutes 2 seconds  Total Procedure Duration: 0 hours 16 minutes 14 seconds  Findings:                 The perianal and digital rectal examinations were                            normal.                           Two sessile polyps were found in the transverse                            colon and cecum. The polyps were 4 to 6 mm in size.                            These polyps were removed with a cold snare.  Resection and retrieval were complete.                           A 9 mm polyp was found in the ascending colon. The                            polyp was pedunculated. The polyp was removed with                            a hot snare. Resection and retrieval were complete.                           A 1 mm polyp was found in the transverse colon. The                            polyp was sessile. The polyp was removed with a                            cold biopsy forceps. Resection and retrieval were                            complete.                           Non-bleeding internal hemorrhoids were found during                            retroflexion. The hemorrhoids were small.                           The exam was otherwise without abnormality. Complications:            No immediate complications. Estimated Blood Loss:     Estimated blood  loss was minimal. Impression:               - Two 4 to 6 mm polyps in the transverse colon and                            in the cecum, removed with a cold snare. Resected                            and retrieved.                           - One 9 mm polyp in the ascending colon, removed                            with a hot snare. Resected and retrieved.                           - One 1 mm polyp in the transverse colon, removed                            with a cold biopsy  forceps. Resected and retrieved.                           - Non-bleeding internal hemorrhoids.                           - The examination was otherwise normal. Recommendation:           - Patient has a contact number available for                            emergencies. The signs and symptoms of potential                            delayed complications were discussed with the                            patient. Return to normal activities tomorrow.                            Written discharge instructions were provided to the                            patient.                           - Resume previous diet.                           - Continue present medications.                           - Await pathology results.                           - Repeat colonoscopy in 3 - 5 years for                            surveillance based on pathology results. Mauri Pole, MD 07/29/2017 4:22:50 PM This report has been signed electronically.

## 2017-07-29 NOTE — Patient Instructions (Signed)
**  Handouts given on Gastritis and polyps**   YOU HAD AN ENDOSCOPIC PROCEDURE TODAY: Refer to the procedure report and other information in the discharge instructions given to you for any specific questions about what was found during the examination. If this information does not answer your questions, please call [facility] office at [phone] to clarify.   YOU SHOULD EXPECT: Some feelings of bloating in the abdomen. Passage of more gas than usual. Walking can help get rid of the air that was put into your GI tract during the procedure and reduce the bloating. If you had a lower endoscopy (such as a colonoscopy or flexible sigmoidoscopy) you may notice spotting of blood in your stool or on the toilet paper. Some abdominal soreness may be present for a day or two, also.  DIET: Your first meal following the procedure should be a light meal and then it is ok to progress to your normal diet. A half-sandwich or bowl of soup is an example of a good first meal. Heavy or fried foods are harder to digest and may make you feel nauseous or bloated. Drink plenty of fluids but you should avoid alcoholic beverages for 24 hours. If you had a esophageal dilation, please see attached instructions for diet.    ACTIVITY: Your care partner should take you home directly after the procedure. You should plan to take it easy, moving slowly for the rest of the day. You can resume normal activity the day after the procedure however YOU SHOULD NOT DRIVE, use power tools, machinery or perform tasks that involve climbing or major physical exertion for 24 hours (because of the sedation medicines used during the test).   SYMPTOMS TO REPORT IMMEDIATELY: A gastroenterologist can be reached at any hour. Please call [phone]  for any of the following symptoms:  Following lower endoscopy (colonoscopy, flexible sigmoidoscopy) Excessive amounts of blood in the stool  Significant tenderness, worsening of abdominal pains  Swelling of  the abdomen that is new, acute  Fever of 100 or higher  Following upper endoscopy (EGD, EUS, ERCP, esophageal dilation) Vomiting of blood or coffee ground material  New, significant abdominal pain  New, significant chest pain or pain under the shoulder blades  Painful or persistently difficult swallowing  New shortness of breath  Black, tarry-looking or red, bloody stools  FOLLOW UP:  If any biopsies were taken you will be contacted by phone or by letter within the next 1-3 weeks. Call [phone]  if you have not heard about the biopsies in 3 weeks.  Please also call with any specific questions about appointments or follow up tests.

## 2017-07-30 ENCOUNTER — Telehealth: Payer: Self-pay | Admitting: Gastroenterology

## 2017-07-30 ENCOUNTER — Telehealth: Payer: Self-pay

## 2017-07-30 ENCOUNTER — Telehealth: Payer: Self-pay | Admitting: *Deleted

## 2017-07-30 NOTE — Telephone Encounter (Signed)
Please advise her to take PPI in the AM and take celexa in the PM. If her GI symptoms improve, ok to stop using PPI after 3 months

## 2017-07-30 NOTE — Telephone Encounter (Signed)
Per Dr Silverio Decamp she has to be on omeprazole for ulcers. She will take omeprazole in the AM and the celexa in the PM

## 2017-07-30 NOTE — Telephone Encounter (Signed)
  Follow up Call-  Call back number 07/29/2017  Post procedure Call Back phone  # [phone]  Permission to leave phone message Yes  Some recent data might be hidden     Left message

## 2017-07-30 NOTE — Telephone Encounter (Signed)
Dr Silverio Decamp Please advise   Pharmacy claiming this is a drug interation

## 2017-07-30 NOTE — Telephone Encounter (Signed)
No answer. Name identifier. Message left to call if questions or concerns. 

## 2017-07-31 NOTE — Telephone Encounter (Signed)
Pharmacy calling back about drug interraction

## 2017-07-31 NOTE — Telephone Encounter (Signed)
Dr Silverio Decamp  Pharmacy recommends you switching the patient from Omeprazole to another PPI,, They state patient is on too high of a dose of celexa to be on omeprazole

## 2017-07-31 NOTE — Telephone Encounter (Signed)
Called Alicia Farmer and changed prescription to 40 mg daily of Protonix

## 2017-07-31 NOTE — Telephone Encounter (Signed)
Ok please switch to Protonix 40mg  daily, thanks

## 2017-08-10 ENCOUNTER — Encounter: Payer: Self-pay | Admitting: Gastroenterology

## 2017-09-07 ENCOUNTER — Ambulatory Visit: Payer: Self-pay | Admitting: Internal Medicine

## 2017-10-12 ENCOUNTER — Telehealth: Payer: Self-pay | Admitting: Gastroenterology

## 2017-10-13 MED ORDER — SUCRALFATE 1 GM/10ML PO SUSP: 1.0000 g | Freq: Three times a day (TID) | ORAL | 2 refills | Status: DC

## 2017-10-13 NOTE — Telephone Encounter (Signed)
Carafate sent to pharmacy.

## 2017-11-25 ENCOUNTER — Other Ambulatory Visit: Payer: Self-pay | Admitting: Gastroenterology

## 2018-06-12 IMAGING — CR DG CHEST 2V
2 series · 2 of 2 positions shown · non-contrast
Comparison: None.

CLINICAL DATA: Pre bariatric surgery

EXAM:
CHEST  2 VIEW

[w chest pa *]
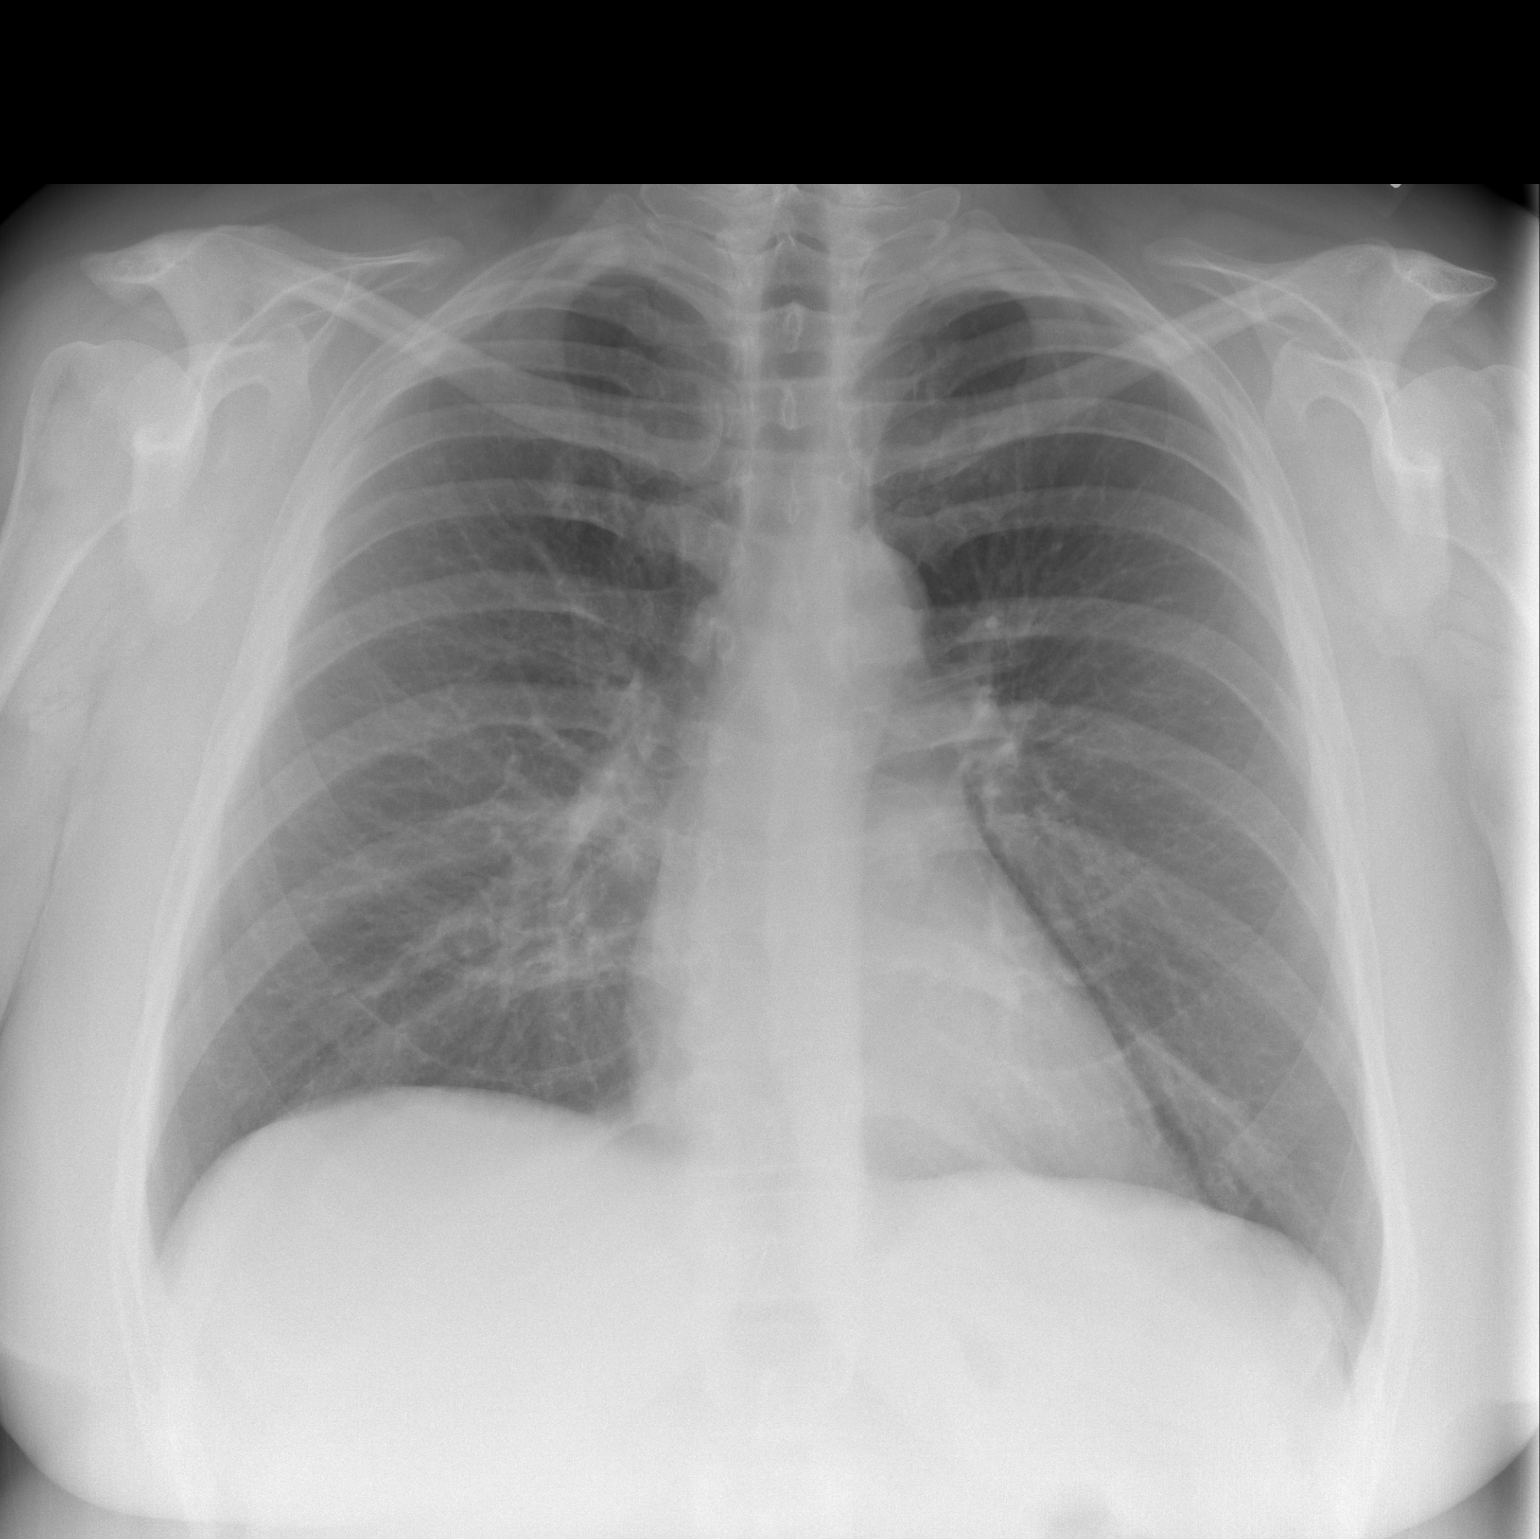

[w chest lat *]
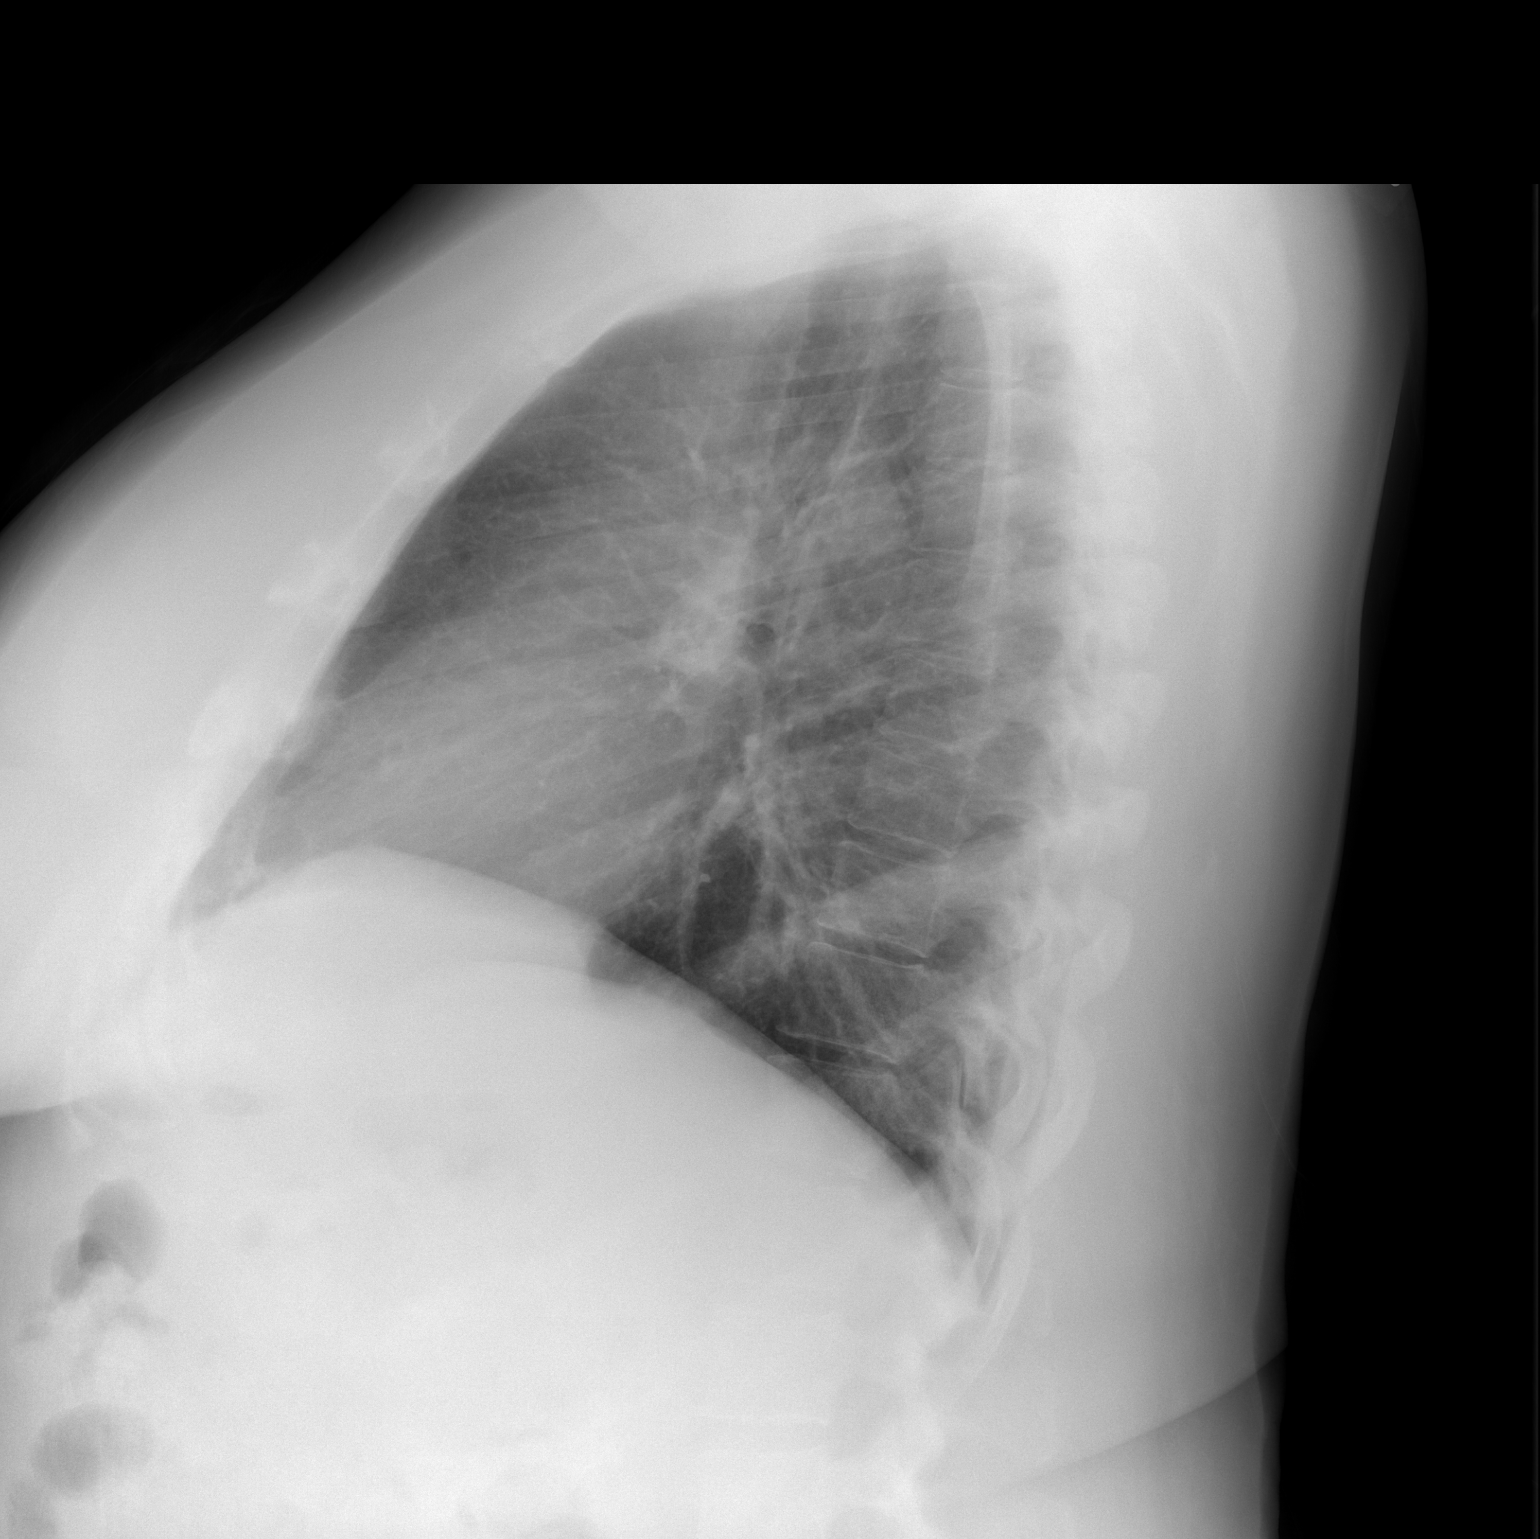

[2 of 2 positions shown; findings below may reference images not displayed]

FINDINGS: Heart and mediastinal contours are within normal limits. No focal
opacities or effusions. No acute bony abnormality.
IMPRESSION: No active cardiopulmonary disease.

## 2019-01-04 ENCOUNTER — Encounter (HOSPITAL_COMMUNITY): Payer: Self-pay

## 2020-01-05 ENCOUNTER — Encounter (HOSPITAL_COMMUNITY): Payer: Self-pay

## 2020-11-04 ENCOUNTER — Encounter: Payer: Self-pay | Admitting: Gastroenterology
